# Patient Record
Sex: Male | Born: 1955 | Race: White | Hispanic: No | Marital: Married | Smoking: Former smoker
Health system: Southern US, Community
[De-identification: ages and names within clinical notes are randomized; demographics above are authoritative.]

## PROBLEM LIST (undated history)

## (undated) DIAGNOSIS — E785 Hyperlipidemia, unspecified: Secondary | ICD-10-CM

## (undated) DIAGNOSIS — I10 Essential (primary) hypertension: Secondary | ICD-10-CM

## (undated) DIAGNOSIS — I219 Acute myocardial infarction, unspecified: Secondary | ICD-10-CM

## (undated) HISTORY — PX: OTHER SURGICAL HISTORY: SHX169

## (undated) HISTORY — DX: Essential (primary) hypertension: I10

## (undated) HISTORY — DX: Hyperlipidemia, unspecified: E78.5

## (undated) HISTORY — DX: Acute myocardial infarction, unspecified: I21.9

---

## 2014-01-10 ENCOUNTER — Emergency Department (HOSPITAL_COMMUNITY): Payer: 59

## 2014-01-10 ENCOUNTER — Inpatient Hospital Stay (HOSPITAL_COMMUNITY)
Admission: EM | Admit: 2014-01-10 | Discharge: 2014-01-12 | DRG: 247 | Disposition: A | Discharge status: Home / Self Care | Payer: 59 | Attending: Interventional Cardiology | Admitting: Interventional Cardiology

## 2014-01-10 ENCOUNTER — Encounter (HOSPITAL_COMMUNITY): Payer: Self-pay | Admitting: Emergency Medicine

## 2014-01-10 ENCOUNTER — Encounter (HOSPITAL_COMMUNITY)
Admission: EM | Disposition: A | Discharge status: Home / Self Care | Payer: 59 | Source: Home / Self Care | Attending: Interventional Cardiology

## 2014-01-10 DIAGNOSIS — Z955 Presence of coronary angioplasty implant and graft: Secondary | ICD-10-CM

## 2014-01-10 DIAGNOSIS — R339 Retention of urine, unspecified: Secondary | ICD-10-CM | POA: Diagnosis not present

## 2014-01-10 DIAGNOSIS — I2582 Chronic total occlusion of coronary artery: Secondary | ICD-10-CM | POA: Diagnosis present

## 2014-01-10 DIAGNOSIS — Z7982 Long term (current) use of aspirin: Secondary | ICD-10-CM | POA: Diagnosis not present

## 2014-01-10 DIAGNOSIS — I2109 ST elevation (STEMI) myocardial infarction involving other coronary artery of anterior wall: Secondary | ICD-10-CM | POA: Diagnosis present

## 2014-01-10 DIAGNOSIS — E785 Hyperlipidemia, unspecified: Secondary | ICD-10-CM | POA: Diagnosis present

## 2014-01-10 DIAGNOSIS — I255 Ischemic cardiomyopathy: Secondary | ICD-10-CM

## 2014-01-10 DIAGNOSIS — Z7902 Long term (current) use of antithrombotics/antiplatelets: Secondary | ICD-10-CM | POA: Diagnosis not present

## 2014-01-10 DIAGNOSIS — I213 ST elevation (STEMI) myocardial infarction of unspecified site: Secondary | ICD-10-CM

## 2014-01-10 DIAGNOSIS — I10 Essential (primary) hypertension: Secondary | ICD-10-CM

## 2014-01-10 DIAGNOSIS — I2510 Atherosclerotic heart disease of native coronary artery without angina pectoris: Secondary | ICD-10-CM

## 2014-01-10 DIAGNOSIS — I2589 Other forms of chronic ischemic heart disease: Secondary | ICD-10-CM | POA: Diagnosis present

## 2014-01-10 DIAGNOSIS — Z79899 Other long term (current) drug therapy: Secondary | ICD-10-CM

## 2014-01-10 DIAGNOSIS — Z87891 Personal history of nicotine dependence: Secondary | ICD-10-CM | POA: Diagnosis not present

## 2014-01-10 DIAGNOSIS — R079 Chest pain, unspecified: Secondary | ICD-10-CM | POA: Diagnosis present

## 2014-01-10 HISTORY — PX: LEFT HEART CATHETERIZATION WITH CORONARY ANGIOGRAM: SHX5451

## 2014-01-10 LAB — COMPREHENSIVE METABOLIC PANEL
ALK PHOS: 69 U/L (ref 39–117)
ALT: 55 U/L — ABNORMAL HIGH (ref 0–53)
ANION GAP: 20 — AB (ref 5–15)
AST: 38 U/L — ABNORMAL HIGH (ref 0–37)
Albumin: 4 g/dL (ref 3.5–5.2)
BILIRUBIN TOTAL: 0.5 mg/dL (ref 0.3–1.2)
BUN: 15 mg/dL (ref 6–23)
CO2: 17 mEq/L — ABNORMAL LOW (ref 19–32)
Calcium: 9 mg/dL (ref 8.4–10.5)
Chloride: 98 mEq/L (ref 96–112)
Creatinine, Ser: 1.01 mg/dL (ref 0.50–1.35)
GFR calc Af Amer: >90 mL/min (ref >90)
GFR calc non Af Amer: 79 mL/min — ABNORMAL LOW (ref >90)
GLUCOSE: 169 mg/dL — AB (ref 70–99)
POTASSIUM: 4.2 meq/L (ref 3.7–5.3)
Sodium: 135 mEq/L — ABNORMAL LOW (ref 137–147)
TOTAL PROTEIN: 6.9 g/dL (ref 6.0–8.3)

## 2014-01-10 LAB — I-STAT TROPONIN, ED: Troponin i, poc: 0.01 ng/mL (ref 0.00–0.08)

## 2014-01-10 LAB — PLATELET COUNT: PLATELETS: 215 10*3/uL (ref 150–400)

## 2014-01-10 LAB — CBC
HEMATOCRIT: 47.8 % (ref 39.0–52.0)
Hemoglobin: 16.9 g/dL (ref 13.0–17.0)
MCH: 31 pg (ref 26.0–34.0)
MCHC: 35.4 g/dL (ref 30.0–36.0)
MCV: 87.7 fL (ref 78.0–100.0)
Platelets: 217 10*3/uL (ref 150–400)
RBC: 5.45 MIL/uL (ref 4.22–5.81)
RDW: 14 % (ref 11.5–15.5)
WBC: 6 10*3/uL (ref 4.0–10.5)

## 2014-01-10 LAB — TROPONIN I: Troponin I: <0.3 ng/mL (ref <0.30)

## 2014-01-10 LAB — MRSA PCR SCREENING: MRSA by PCR: NEGATIVE

## 2014-01-10 LAB — LIPASE, BLOOD: Lipase: 403 U/L — ABNORMAL HIGH (ref 11–59)

## 2014-01-10 SURGERY — LEFT HEART CATHETERIZATION WITH CORONARY ANGIOGRAM
Anesthesia: LOCAL

## 2014-01-10 MED ORDER — NITROGLYCERIN 1 MG/10 ML FOR IR/CATH LAB
INTRA_ARTERIAL | Status: AC
  Filled 2014-01-10: qty 10

## 2014-01-10 MED ORDER — HEPARIN SODIUM (PORCINE) 5000 UNIT/ML IJ SOLN
4000.0000 [IU] | INTRAMUSCULAR | Status: AC
  Administered 2014-01-10: 4000 [IU] via INTRAVENOUS
  Filled 2014-01-10: qty 1

## 2014-01-10 MED ORDER — MORPHINE SULFATE 4 MG/ML IJ SOLN
4.0000 mg | Freq: Once | INTRAMUSCULAR | Status: AC
  Administered 2014-01-10: 4 mg via INTRAVENOUS
  Filled 2014-01-10: qty 1

## 2014-01-10 MED ORDER — ONDANSETRON HCL 4 MG/2ML IJ SOLN
4.0000 mg | Freq: Once | INTRAMUSCULAR | Status: DC
  Filled 2014-01-10: qty 2

## 2014-01-10 MED ORDER — TICAGRELOR 90 MG PO TABS
90.0000 mg | ORAL_TABLET | Freq: Two times a day (BID) | ORAL | Status: DC
  Administered 2014-01-10 – 2014-01-12 (×4): 90 mg via ORAL
  Filled 2014-01-10 (×6): qty 1

## 2014-01-10 MED ORDER — BIVALIRUDIN 250 MG IV SOLR
INTRAVENOUS | Status: AC
  Filled 2014-01-10: qty 250

## 2014-01-10 MED ORDER — HEPARIN (PORCINE) IN NACL 2-0.9 UNIT/ML-% IJ SOLN
INTRAMUSCULAR | Status: AC
  Filled 2014-01-10: qty 500

## 2014-01-10 MED ORDER — ACETAMINOPHEN 325 MG PO TABS: 650.0000 mg | ORAL_TABLET | ORAL | Status: DC | PRN

## 2014-01-10 MED ORDER — ATORVASTATIN CALCIUM 80 MG PO TABS
80.0000 mg | ORAL_TABLET | Freq: Every day | ORAL | Status: DC
  Administered 2014-01-10 – 2014-01-11 (×2): 80 mg via ORAL
  Filled 2014-01-10 (×3): qty 1

## 2014-01-10 MED ORDER — TICAGRELOR 90 MG PO TABS
ORAL_TABLET | ORAL | Status: AC
  Filled 2014-01-10: qty 2

## 2014-01-10 MED ORDER — CETYLPYRIDINIUM CHLORIDE 0.05 % MT LIQD
7.0000 mL | Freq: Two times a day (BID) | OROMUCOSAL | Status: DC
  Administered 2014-01-10 – 2014-01-12 (×2): 7 mL via OROMUCOSAL

## 2014-01-10 MED ORDER — HEART ATTACK BOUNCING BOOK
Freq: Once | Status: AC
Start: 2014-01-11 — End: 2014-01-11
  Administered 2014-01-11: 06:00:00
  Filled 2014-01-10: qty 1

## 2014-01-10 MED ORDER — ONDANSETRON HCL 4 MG/2ML IJ SOLN
4.0000 mg | Freq: Once | INTRAMUSCULAR | Status: AC
  Administered 2014-01-10: 4 mg via INTRAVENOUS

## 2014-01-10 MED ORDER — TIROFIBAN HCL IV 5 MG/100ML
INTRAVENOUS | Status: AC
  Filled 2014-01-10: qty 100

## 2014-01-10 MED ORDER — ASPIRIN 81 MG PO CHEW
81.0000 mg | CHEWABLE_TABLET | Freq: Every day | ORAL | Status: DC
  Administered 2014-01-11 – 2014-01-12 (×2): 81 mg via ORAL
  Filled 2014-01-10 (×2): qty 1

## 2014-01-10 MED ORDER — NITROGLYCERIN 0.4 MG SL SUBL
0.4000 mg | SUBLINGUAL_TABLET | SUBLINGUAL | Status: DC | PRN
  Administered 2014-01-10 (×3): 0.4 mg via SUBLINGUAL
  Filled 2014-01-10 (×3): qty 1

## 2014-01-10 MED ORDER — MIDAZOLAM HCL 2 MG/2ML IJ SOLN
INTRAMUSCULAR | Status: AC
Start: 2014-01-10 — End: 2014-01-10
  Filled 2014-01-10: qty 2

## 2014-01-10 MED ORDER — OXYCODONE-ACETAMINOPHEN 5-325 MG PO TABS
1.0000 | ORAL_TABLET | ORAL | Status: DC | PRN
  Administered 2014-01-10 – 2014-01-11 (×3): 1 via ORAL
  Administered 2014-01-11: 2 via ORAL
  Filled 2014-01-10 (×2): qty 1
  Filled 2014-01-10: qty 2
  Filled 2014-01-10 (×2): qty 1

## 2014-01-10 MED ORDER — SODIUM CHLORIDE 0.9 % IV SOLN
1.0000 mL/kg/h | INTRAVENOUS | Status: AC
  Administered 2014-01-10: 1 mL/kg/h via INTRAVENOUS

## 2014-01-10 MED ORDER — METOPROLOL TARTRATE 12.5 MG HALF TABLET
12.5000 mg | ORAL_TABLET | Freq: Two times a day (BID) | ORAL | Status: DC
  Administered 2014-01-10 – 2014-01-11 (×3): 12.5 mg via ORAL
  Filled 2014-01-10 (×4): qty 1

## 2014-01-10 MED ORDER — LIDOCAINE HCL (PF) 1 % IJ SOLN
INTRAMUSCULAR | Status: AC
  Filled 2014-01-10: qty 30

## 2014-01-10 MED ORDER — NITROGLYCERIN IN D5W 200-5 MCG/ML-% IV SOLN
10.0000 ug/min | INTRAVENOUS | Status: AC
  Administered 2014-01-10: 10 ug/min via INTRAVENOUS

## 2014-01-10 MED ORDER — ASPIRIN 325 MG PO TABS
325.0000 mg | ORAL_TABLET | ORAL | Status: AC
  Administered 2014-01-10: 325 mg via ORAL
  Filled 2014-01-10: qty 1

## 2014-01-10 MED ORDER — VERAPAMIL HCL 2.5 MG/ML IV SOLN
INTRAVENOUS | Status: AC
  Filled 2014-01-10: qty 2

## 2014-01-10 MED ORDER — SODIUM CHLORIDE 0.9 % IV SOLN
INTRAVENOUS | Status: DC
  Administered 2014-01-10: 09:00:00 via INTRAVENOUS

## 2014-01-10 MED ORDER — FENTANYL CITRATE 0.05 MG/ML IJ SOLN
50.0000 ug | Freq: Once | INTRAMUSCULAR | Status: AC
  Administered 2014-01-10: 50 ug via INTRAVENOUS
  Filled 2014-01-10: qty 2

## 2014-01-10 MED ORDER — LISINOPRIL 2.5 MG PO TABS
2.5000 mg | ORAL_TABLET | Freq: Every day | ORAL | Status: DC
  Administered 2014-01-10 – 2014-01-11 (×2): 2.5 mg via ORAL
  Filled 2014-01-10 (×2): qty 1

## 2014-01-10 MED ORDER — FENTANYL CITRATE 0.05 MG/ML IJ SOLN
INTRAMUSCULAR | Status: AC
  Filled 2014-01-10: qty 2

## 2014-01-10 MED ORDER — TIROFIBAN (AGGRASTAT) BOLUS VIA INFUSION
25.0000 ug/kg | Freq: Once | INTRAVENOUS | Status: AC
  Administered 2014-01-10: 1927.5 ug via INTRAVENOUS
  Filled 2014-01-10: qty 39

## 2014-01-10 MED ORDER — ONDANSETRON HCL 4 MG/2ML IJ SOLN
4.0000 mg | Freq: Four times a day (QID) | INTRAMUSCULAR | Status: DC | PRN
  Administered 2014-01-10: 4 mg via INTRAVENOUS
  Filled 2014-01-10: qty 2

## 2014-01-10 NOTE — Care Management Note (Signed)
    Page 1 of 1   01/10/2014     12:00:08 PM CARE MANAGEMENT NOTE 01/10/2014  Patient:  Brian Dickerson,Brian Dickerson   Account Number:  [id number]  Date Initiated:  01/10/2014  Documentation initiated by:  Elissa Hefty  Subjective/Objective Assessment:   adm w mi     Action/Plan:   lives w wife, pcp dr Aura Dials   Anticipated DC Date:     Anticipated DC Plan:        [place]  CM consult  Medication Assistance      Choice offered to / List presented to:             Status of service:   Medicare Important Message given?   (If response is "NO", the following Medicare IM given date fields will be blank) Date Medicare IM given:   Medicare IM given by:   Date Additional Medicare IM given:   Additional Medicare IM given by:    Discharge Disposition:    Per UR Regulation:  Reviewed for med. necessity/level of care/duration of stay  If discussed at [place] of Stay Meetings, dates discussed:    Comments:  9/4 1159 debbie Bryne Lindon rn,bsn left pt 30day free brilinta card and copay assist card.

## 2014-01-10 NOTE — CV Procedure (Addendum)
Left Heart Catheterization with Coronary Angiography and PCI Report  Brian Dickerson  58 y.o.  male 12-29-1955  Procedure Date: 01/10/2014 Referring Physician: None Primary Cardiologist: [facility] Blenda Bridegroom, M.D.  INDICATIONS: Acute coronary syndrome and possible ST elevation MI (EKG is ambiguous)  PROCEDURE: 1. Left heart catheterization; 2. Coronary angiography; 3. Left ventriculography; 4. Drug-eluting stent mid LAD  CONSENT:  The risks, benefits, and details of the procedure were explained in detail to the patient. Risks including death, stroke, heart attack, kidney injury, allergy, limb ischemia, bleeding and radiation injury were discussed.  The patient verbalized understanding and wanted to proceed.  Informed written consent was obtained.  PROCEDURE TECHNIQUE:  After Xylocaine anesthesia a 5 French Slender sheath was placed in the right radial artery with an angiocath and the modified Seldinger technique.  Coronary angiography was done using a 5 F JR 4 and XB LAD 3.5 cm guide catheter.  Left ventriculography was done using the JR 4 catheter and hand injection.   The digital images were reviewed and demonstrated that the mid LAD beyond a large second diagonal was totally occluded. The LV gram also demonstrated distal anterior wall and inferoapical severe hypokinesis.  We gave intravenous bivalirudin bolus and an infusion to achieve an ACT of 300. The patient was loaded with Brilinta, 180 mg orally . We then perform PCI on the LAD over a 0.014 Pro-water angioplasty wire. This caused partial reperfusion and significant thrombus burden was noted beyond the large diagonal. We initially used a 0.014 BMW wire to protect the large diagonal branch during the initial inflation. The diagonal branch remained patent after predilating the total occlusion and segmental stenosis with a 3.0 x 20 mm balloon. We then positioned and deployed a 3.0 x 38 mm long Promus Premier drug-eluting stent deployed at  12 atmospheres. Postdilatation was then performed using a 3.5 x 20 mm long Barling Euphora to 13 atmospheres throughout the stented segment. Because of persistent impingement on the stent in the mid vessel a 3.75 x 12 mm U4 balloon was positioned and inflated to 13 atmospheres. This balloon was then used in an overlapping fashion extending back to the proximal margin. Multiple doses of intracoronary nitroglycerin were administered. Post procedure TIMI grade 3 flow was noted. The second and third diagonal branches remained patent with TIMI grade 3 flow .  There were no complications. Hemostasis was achieved with a wrist band in the right radial region.   CONTRAST:  Total of 250 cc.  COMPLICATIONS:  Urinary retention   HEMODYNAMICS:  Aortic pressure 132/65 mmHg; LV pressure 132/5 mmHg; LVEDP 13 mm mercury  ANGIOGRAPHIC DATA:   The left main coronary artery is widely patent.  The left anterior descending artery is severely diseased with a segmental 90% stenosis starting after the first septal perforator becoming a total occlusion after the large second diagonal branch. The diagonals are widely patent despite high-grade disease in the mid LAD.  The left circumflex artery is widely patent and gives origin to 2 obtuse marginal branches which contain no significant obstruction..  The right coronary artery is dominant. Mid vessel 30% narrowing is noted. A large PDA and 2 left ventricular branch is also widely patent.  PCI RESULTS: The totally occluded segmental stenosis in the mid LAD was reduced to 0% with TIMI grade 3 flow after positioning and deploying a 3.0 x 38 mm long drug-eluting stent (Promus Premier) post dilated to 3.75 mm in diameter.  LEFT VENTRICULOGRAM:  Left ventricular angiogram was  done in the 30 RAO projection and revealed apical and inferoapical severe hypokinesis. Ejection fraction 40%.   IMPRESSIONS:  1. Acute coronary syndrome/ST elevation anterior myocardial infarction due to  thrombotic occlusion of the mid LAD treated with drug-eluting stent with resultant 0% stenosis and TIMI grade 3 flow as outlined above. 2. Widely patent circumflex and right coronary 3. Left ventricular dysfunction with apical and inferoapical severe hypokinesis. LVEF 40% 4. Time to reperfusion was increased due to ambiguous EKG, that in retrospect was highly suggestive of ST elevation MI given the patient's complaints.   RECOMMENDATION:  Aspirin and Brilinta Beta blocker therapy ACE inhibitor therapy Statin therapy Potential candidate for early discharge on Sunday morning assuming no arrhythmias, heart failure, recurrent ischemia, or mechanical complications. Aggrastat bolus given at completion of case the thrombus burden.

## 2014-01-10 NOTE — Progress Notes (Signed)
Right radial TR band removed; site level 0.  Radial pulses equal/+3.  Will continue to monitor. [place], Ardeth Sportsman

## 2014-01-10 NOTE — ED Notes (Addendum)
First EKG unclear, repeat at 7:15 Delay due to hair on the PT's chest.

## 2014-01-10 NOTE — ED Provider Notes (Signed)
CSN: [id number]     Arrival date & time 01/10/14  0703 History   First MD Initiated Contact with Patient 01/10/14 [phone]     Chief Complaint  Patient presents with  . Chest Pain     (Consider location/radiation/quality/duration/timing/severity/associated sxs/prior Treatment) Patient is a 58 y.o. male presenting with chest pain. The history is provided by the patient.  Chest Pain Pain location:  Substernal area Pain quality: tightness   Radiates to: initially upper back, buttently no radiation. Pain severity:  Severe Duration:  2 hours Timing:  Constant Progression:  Waxing and waning Chronicity:  New Context: not breathing, no drug use, not lifting, no movement, no stress and no trauma   Associated symptoms: diaphoresis, dizziness, nausea and near-syncope   Associated symptoms: no shortness of breath and no syncope   Risk factors: male sex and smoking   Risk factors: no aortic disease, no coronary artery disease, no diabetes mellitus, no Ehlers-Danlos syndrome, no high cholesterol, no hypertension, no immobilization, no Marfan's syndrome and no prior DVT/PE     History reviewed. No pertinent past medical history. History reviewed. No pertinent past surgical history. History reviewed. No pertinent family history. History  Substance Use Topics  . Smoking status: Former Smoker    Types: Cigarettes  . Smokeless tobacco: Never Used  . Alcohol Use: 2.4 oz/week    4 Cans of beer per week    Review of Systems  Constitutional: Positive for chills and diaphoresis.  Eyes: Negative for visual disturbance.  Respiratory: Negative for shortness of breath.   Cardiovascular: Positive for chest pain and near-syncope. Negative for syncope.  Gastrointestinal: Positive for nausea.  Genitourinary: Negative for dysuria.  Skin: Negative for rash.  Neurological: Positive for dizziness and light-headedness. Negative for syncope.  Hematological: Does not bruise/bleed easily.   Psychiatric/Behavioral: Negative for confusion.  All other systems reviewed and are negative.     Allergies  Review of patient's allergies indicates no known allergies.  Home Medications   Prior to Admission medications   Not on File   BP 124/75  Pulse 53  Temp(Src) 97.9 F (36.6 C) (Oral)  Resp 14  Ht 5\' 5"  (1.651 m)  Wt 170 lb (77.111 kg)  BMI 28.29 kg/m2  SpO2 97% Physical Exam  Nursing note and vitals reviewed. Constitutional: He is oriented to person, place, and time. He appears well-developed.  HENT:  Head: Normocephalic and atraumatic.  Eyes: Conjunctivae and EOM are normal. Pupils are equal, round, and reactive to light.  Neck: Normal range of motion. Neck supple. No JVD present.  Cardiovascular: Normal rate, regular rhythm and intact distal pulses.   No murmur heard. Pulmonary/Chest: Effort normal and breath sounds normal.  Abdominal: Soft. Bowel sounds are normal. He exhibits no distension. There is no tenderness. There is no rebound and no guarding.  Musculoskeletal: Normal range of motion. He exhibits no edema and no tenderness.  Neurological: He is alert and oriented to person, place, and time. No cranial nerve deficit.  Skin: Skin is warm. He is not diaphoretic.    ED Course  Procedures (including critical care time) Labs Review Labs Reviewed  LIPASE, BLOOD - Abnormal; Notable for the following:    Lipase 403 (*)    All other components within normal limits  COMPREHENSIVE METABOLIC PANEL - Abnormal; Notable for the following:    CO2 17 (*)    Glucose, Bld 169 (*)    AST 38 (*)    ALT 55 (*)    GFR calc non  Af Amer 79 (*)    All other components within normal limits  CBC  TROPONIN I  I-STAT TROPOININ, ED    Imaging Review Dg Chest 2 View  01/10/2014   CLINICAL DATA:  Chest pain  EXAM: CHEST  2 VIEW  COMPARISON:  None.  FINDINGS: The heart size and mediastinal contours are within normal limits. Both lungs are clear. The visualized skeletal  structures are unremarkable.  IMPRESSION: No acute cardiopulmonary process.   Electronically Signed   By: Lovey Newcomer M.D.   On: 01/10/2014 08:33     EKG Interpretation   Date/Time:  Friday January 10 2014 07:51:27 EDT Ventricular Rate:  54 PR Interval:  118 QRS Duration: 91 QT Interval:  433 QTC Calculation: 410 R Axis:   22 Text Interpretation:  Sinus rhythm Borderline short PR interval ST  elevation, consider anterior injury, anterior leads, more pronounced New  changes appreciated Confirmed by Kathrynn Humble, MD, Skilar Marcou [phone]) on 01/10/2014  8:58:30 AM         EKG Interpretation  Date/Time:  Friday January 10 2014 07:51:27 EDT Ventricular Rate:  54 PR Interval:  118 QRS Duration: 91 QT Interval:  433 QTC Calculation: 410 R Axis:   22 Text Interpretation:  Sinus rhythm Borderline short PR interval ST elevation, consider anterior injury, anterior leads, more pronounced New changes appreciated Confirmed by Kathrynn Humble, MD, Thelma Comp [phone]) on 01/10/2014 8:58:30 AM        Date: 01/10/2014  Rate: 53  Rhythm: sinus bradycardia  QRS Axis: normal  Intervals: normal  ST/T Wave abnormalities: ST elevations inferiorly and ST elevations anteriorly  Conduction Disutrbances:none  Narrative Interpretation:   Old EKG Reviewed: changes noted  Inf STE more pronounced, still < 28mm, V2-v4 unchanged from the previous.     MDM   Final diagnoses:  ST elevation myocardial infarction (STEMI), unspecified artery    Pt comes in with cc of chest pain. Atypical. Misternal mostly, and at one point radiating to the back. No risk factors for CAD, besides former smoker and age. Pt reportedly clammy at some point. Currently, not in distress, but appears uncomfortable. Neurovascular exam is normal. No risk factors for dissection.  Initial EKG - STEMI not called. Some inconsistent changes noted in v2, v3.  I ordered a repeat EKG after my evaluation, and the repeat shows a bit more concerning ST  changes in v2, v3. Given i was taking care of patient with respiratory distress on bipap in resuscitation room, the 2nd ekg was interpreted a little later.  Pt was immediately reassessed, and his pain was still the same, not responsive to nitro. 3rd EKG was ordered, and Cath team called. Spoke with Dr. Martinique at 8:50, and the 3rd ekg had printed by that time, and after discussing the case with Dr. Martinique, [place] [facility] activated.  Pt hemodynamically stable. Made aware of the plan to go to the cath lab.  CRITICAL CARE Performed by: Varney Biles   Total critical care time: 45 minutes  Critical care time was exclusive of separately billable procedures and treating other patients.  Critical care was necessary to treat or prevent imminent or life-threatening deterioration.  Critical care was time spent personally by me on the following activities: development of treatment plan with patient and/or surrogate as well as nursing, discussions with consultants, evaluation of patient's response to treatment, examination of patient, obtaining history from patient or surrogate, ordering and performing treatments and interventions, ordering and review of laboratory studies, ordering and review of radiographic studies,  pulse oximetry and re-evaluation of patient's condition.     Varney Biles, MD 01/10/14 [phone]

## 2014-01-10 NOTE — H&P (Addendum)
Brian Dickerson is a 58 y.o. male  Admit Date: 01/10/2014 Referring Physician: [facility] Primary Cardiologist:: None Chief complaint / reason for admission: Prolonged chest pain with ambiguous EKG abnormalities anteriorly  HPI: A 58 year old gentleman who presented to the emergency room with substernal discomfort. Initial EKG demonstrated prominent precordial T waves with minimal ST elevation. Chest discomfort as being continuous and ongoing since presentation at 7 AM. The discomfort started spontaneously. Is no radiation or other associated symptoms such as nausea. Has no history of heart disease, diabetes, smoking, or other risk factors. Because of ongoing chest discomfort and ambiguous ECG findings on the anterior leads, I have counseled the patient to undergo coronary angiography and possible PCI if an acute LAD lesion is demonstrated.    PMH:   History reviewed. No pertinent past medical history.  PSH:    Past Surgical History  Procedure Laterality Date  . None     ALLERGIES:   Review of patient's allergies indicates no known allergies. Prior to Admit Meds:   Prescriptions prior to admission  Medication Sig Dispense Refill  . Cholecalciferol (VITAMIN D) 2000 UNITS tablet Take 2,000 Units by mouth daily.       Family HX:   History reviewed. No pertinent family history. Social HX:    History   Social History  . Marital Status: Married    Spouse Name: N/A    Number of Children: N/A  . Years of Education: N/A   Occupational History  . Not on file.   Social History Main Topics  . Smoking status: Former Smoker    Types: Cigarettes  . Smokeless tobacco: Never Used  . Alcohol Use: 2.4 oz/week    4 Cans of beer per week  . Drug Use: No  . Sexual Activity: Not on file   Other Topics Concern  . Not on file   Social History Narrative  . No narrative on file     ROS in good health until chest discomfort started this a.m. No history of stroke,  claudication, myocardial infarction, hypertension, diabetes, hyperlipidemia,  Physical Exam: Blood pressure 125/74, pulse 70, temperature 97.9 F (36.6 C), temperature source Oral, resp. rate 14, height 5\' 5"  (1.651 m), weight 164 lb 3.9 oz (74.5 kg), SpO2 100.00%.   Lying in the emergency room bed expressionless and unable to make eye contact. Eyes are closed. Skin is warm and dry. HEENT exam reveals no jaundice or Neck exam reveals no JVD or carotid bruits Cardiac exam reveals an S4 gallop but no murmur or rub Chest is clear to auscultation and percussion The abdomen is soft without tenderness or pulsatile masses Extremities reveal no edema. Radial pulses and femoral pulses are 2+ and symmetric is no peripheral edema Neurological exam reveals a depressed affect but no focal deficits Labs: Lab Results  Component Value Date   WBC 6.0 01/10/2014   HGB 16.9 01/10/2014   HCT 47.8 01/10/2014   MCV 87.7 01/10/2014   PLT 217 01/10/2014     Recent Labs Lab 01/10/14 0735  NA 135*  K 4.2  CL 98  CO2 17*  BUN 15  CREATININE 1.01  CALCIUM 9.0  PROT 6.9  BILITOT 0.5  ALKPHOS 69  ALT 55*  AST 38*  GLUCOSE 169*   Lab Results  Component Value Date   TROPONINI <0.30 01/10/2014     Radiology:  Chest x-ray is unremarkable  EKG:  ECG reveals prominent T waves with mild J-point elevation no reciprocal  ST segment changes. There is mild J-point elevation in V3 through V6 as well as 23 and aVF. The EKG is suspicious for STEMI is somewhat ambiguous.  ASSESSMENT: 1. Acute coronary syndrome and possibly ST elevation MI although the EKG is not currently diagnostic. The presentation however is impressive and with ongoing chest pain we should treat for STEMI. 2. The attached and depressed affect  Plan:  Emergency coronary angiography is recommended to define anatomy and treat underlying disease if obstruction is found. He could conceivably have a total occlusion with collaterals which are preventing  the EKG from being diagnostic.  The procedure including risks of stroke, death, myocardial infarction, allergy, kidney injury, limb ischemia, among others were discussed in detail and except above the patient.  We were continuously with the patient from first encounter in the emergency room and help to transport him to the cardiac catheterization laboratory on the emergency circumstances. Critical care services were provided a patient who is acutely ill with acute coronary syndrome.  Sinclair Grooms 01/10/2014 11:35 AM

## 2014-01-10 NOTE — Progress Notes (Addendum)
CRITICAL VALUE ALERT  Critical value received: troponin >20 Date of notification:  01/10/2013  Time of notification:  9381  Critical value read back:Yes.    Nurse who received alert:  Deberah Castle   MD notified (1st page):   Time of first page:    MD notified (2nd page):  Time of second page:  Responding MD:    Time MD responded:  Patient was a STEMI; Post PCI

## 2014-01-10 NOTE — ED Notes (Addendum)
Pt from home with c/o sudden onset 9/10 chest pain described as a constant, deep pressure this am.  Pt reports feeling upper back pain, lightheadedness and some nausea.  Pt is cool, pale, and clammy to touch.  Pt in NAD, A&O.

## 2014-01-10 NOTE — ED Notes (Signed)
Placed pt on 2L O2 nasal cannula

## 2014-01-10 NOTE — ED Notes (Signed)
Patient transported to X-ray 

## 2014-01-11 DIAGNOSIS — I219 Acute myocardial infarction, unspecified: Secondary | ICD-10-CM

## 2014-01-11 DIAGNOSIS — I2589 Other forms of chronic ischemic heart disease: Secondary | ICD-10-CM

## 2014-01-11 DIAGNOSIS — I2109 ST elevation (STEMI) myocardial infarction involving other coronary artery of anterior wall: Secondary | ICD-10-CM

## 2014-01-11 DIAGNOSIS — I10 Essential (primary) hypertension: Secondary | ICD-10-CM

## 2014-01-11 LAB — CBC
HCT: 48.1 % (ref 39.0–52.0)
Hemoglobin: 16.3 g/dL (ref 13.0–17.0)
MCH: 30.8 pg (ref 26.0–34.0)
MCHC: 33.9 g/dL (ref 30.0–36.0)
MCV: 90.8 fL (ref 78.0–100.0)
PLATELETS: 221 10*3/uL (ref 150–400)
RBC: 5.3 MIL/uL (ref 4.22–5.81)
RDW: 14.5 % (ref 11.5–15.5)
WBC: 10.2 10*3/uL (ref 4.0–10.5)

## 2014-01-11 LAB — BASIC METABOLIC PANEL
Anion gap: 11 (ref 5–15)
BUN: 10 mg/dL (ref 6–23)
CALCIUM: 9.3 mg/dL (ref 8.4–10.5)
CO2: 27 mEq/L (ref 19–32)
Chloride: 100 mEq/L (ref 96–112)
Creatinine, Ser: 1.06 mg/dL (ref 0.50–1.35)
GFR calc Af Amer: 87 mL/min — ABNORMAL LOW (ref >90)
GFR, EST NON AFRICAN AMERICAN: 75 mL/min — AB (ref >90)
Glucose, Bld: 110 mg/dL — ABNORMAL HIGH (ref 70–99)
POTASSIUM: 4.9 meq/L (ref 3.7–5.3)
SODIUM: 138 meq/L (ref 137–147)

## 2014-01-11 LAB — TROPONIN I

## 2014-01-11 MED ORDER — METOPROLOL TARTRATE 25 MG PO TABS
25.0000 mg | ORAL_TABLET | Freq: Two times a day (BID) | ORAL | Status: DC
  Administered 2014-01-11: 25 mg via ORAL
  Filled 2014-01-11 (×3): qty 1

## 2014-01-11 MED ORDER — LISINOPRIL 5 MG PO TABS
5.0000 mg | ORAL_TABLET | Freq: Every day | ORAL | Status: DC
  Administered 2014-01-12: 5 mg via ORAL
  Filled 2014-01-11: qty 1

## 2014-01-11 NOTE — Progress Notes (Signed)
CARDIAC REHAB PHASE I   PRE:  Rate/Rhythm: Sinus rhythm T wave inversion 65  BP:    Sitting: 111/72    SaO2: 99% Room Air  MODE:  Ambulation: 550 ft   POST:  Rate/Rhythem: 67  BP:    Sitting: 123/71     SaO2: 100% Room Air  Patient ambulated in the hallway without complaints or chest pain independently. Patient given MI booklet, stent card and Brillinta brochure. Reviewed exercise instructions end points of exercise when to call 911 and sublingual nitroglycerin. Brian Dickerson is interested in phase 2 cardiac rehab. Permission granted to contact the patient at home.  [name], [name] RN BSN

## 2014-01-11 NOTE — Progress Notes (Signed)
SUBJECTIVE: The patient is doing well today.  At this time, he denies chest pain, shortness of breath, or any new concerns.  Marland Kitchen antiseptic oral rinse  7 mL Mouth Rinse BID  . aspirin  81 mg Oral Daily  . atorvastatin  80 mg Oral q1800  . lisinopril  2.5 mg Oral Daily  . metoprolol tartrate  12.5 mg Oral BID  . ticagrelor  90 mg Oral BID      OBJECTIVE: Physical Exam: Filed Vitals:   01/11/14 0600 01/11/14 0700 01/11/14 0730 01/11/14 0800  BP: 117/79 130/77 130/77 118/71  Pulse: 54 57  65  Temp:   98.7 F (37.1 C)   TempSrc:   Oral   Resp: 14 15 27 18   Height:      Weight:      SpO2: 97% 96% 95% 95%    Intake/Output Summary (Last 24 hours) at 01/11/14 0946 Last data filed at 01/11/14 0800  Gross per 24 hour  Intake 1198.95 ml  Output   1400 ml  Net -201.05 ml    Telemetry reveals sinus rhythm, reduced ventricular ectopy  GEN- The patient is well appearing, alert and oriented x 3 today.   Head- normocephalic, atraumatic Eyes-  Sclera clear, conjunctiva pink Ears- hearing intact Oropharynx- clear Neck- supple,  Lungs- Clear to ausculation bilaterally, normal work of breathing Heart- Regular rate and rhythm, no murmurs, rubs or gallops, PMI not laterally displaced GI- soft, NT, ND, + BS Extremities- no clubbing, cyanosis, or edema, R wrist access site looks good Skin- no rash or lesion Psych- euthymic mood, full affect Neuro- strength and sensation are intact  LABS: Basic Metabolic Panel:  Recent Labs  01/10/14 0735 01/11/14 0243  NA 135* 138  K 4.2 4.9  CL 98 100  CO2 17* 27  GLUCOSE 169* 110*  BUN 15 10  CREATININE 1.01 1.06  CALCIUM 9.0 9.3   Liver Function Tests:  Recent Labs  01/10/14 0735  AST 38*  ALT 55*  ALKPHOS 69  BILITOT 0.5  PROT 6.9  ALBUMIN 4.0    Recent Labs  01/10/14 0735  LIPASE 403*   CBC:  Recent Labs  01/10/14 0735 01/10/14 1840 01/11/14 0243  WBC 6.0  --  10.2  HGB 16.9  --  16.3  HCT 47.8  --  48.1    MCV 87.7  --  90.8  PLT 217 215 221   Cardiac Enzymes:  Recent Labs  01/10/14 1235 01/10/14 1630 01/10/14 2350  TROPONINI >20.00* >20.00* >20.00*   RADIOLOGY: Dg Chest 2 View  01/10/2014   CLINICAL DATA:  Chest pain  EXAM: CHEST  2 VIEW  COMPARISON:  None.  FINDINGS: The heart size and mediastinal contours are within normal limits. Both lungs are clear. The visualized skeletal structures are unremarkable.  IMPRESSION: No acute cardiopulmonary process.   Electronically Signed   By: Lovey Newcomer M.D.   On: 01/10/2014 08:33    ASSESSMENT AND PLAN:  Principal Problem:   Acute MI anterior wall first episode care Active Problems:   Acute anterior wall MI  1. Acute coronary syndrome/ST elevation anterior myocardial infarction due to thrombotic occlusion of the mid LAD treated with drug-eluting stent with resultant 0% stenosis and TIMI grade 3 flow as outlined above.  Doing well this am Continue to optimize medical therapy Will increase metoprolol to 25mg  BID today Increase lisinopril to 5mg  daily Transfer to telemetry  2. Ischemic CM Continue to optimize medical therapy Will need outpatient echo  to follow  3. HTN Stable No change required today  4. HL Stable No change required today  Per Dr Tamala Julian, would anticipate discharge on Sunday if stable    Thompson Grayer, MD 01/11/2014 9:46 AM

## 2014-01-12 MED ORDER — LISINOPRIL 5 MG PO TABS: 5.0000 mg | ORAL_TABLET | Freq: Every day | ORAL | Status: DC

## 2014-01-12 MED ORDER — ATORVASTATIN CALCIUM 80 MG PO TABS: 80.0000 mg | ORAL_TABLET | Freq: Every day | ORAL | Status: DC

## 2014-01-12 MED ORDER — TICAGRELOR 90 MG PO TABS: 90.0000 mg | ORAL_TABLET | Freq: Two times a day (BID) | ORAL | Status: DC

## 2014-01-12 MED ORDER — ASPIRIN 81 MG PO CHEW: 81.0000 mg | CHEWABLE_TABLET | Freq: Every day | ORAL | Status: AC

## 2014-01-12 MED ORDER — NITROGLYCERIN 0.4 MG SL SUBL: 0.4000 mg | SUBLINGUAL_TABLET | SUBLINGUAL | Status: DC | PRN

## 2014-01-12 MED ORDER — METOPROLOL SUCCINATE ER 50 MG PO TB24: 50.0000 mg | ORAL_TABLET | Freq: Every day | ORAL | Status: DC

## 2014-01-12 MED ORDER — METOPROLOL SUCCINATE ER 50 MG PO TB24
50.0000 mg | ORAL_TABLET | Freq: Every day | ORAL | Status: DC
  Administered 2014-01-12: 50 mg via ORAL
  Filled 2014-01-12 (×2): qty 1

## 2014-01-12 NOTE — Discharge Summary (Signed)
Physician Discharge Summary     Cardiologist:  Tamala Julian Patient ID: Brian Dickerson MRN: [id number] DOB/AGE: [birth year] 58 y.o.  Admit date: 01/10/2014 Discharge date: 01/12/2014  Admission Diagnoses:    Acute MI anterior wall first episode care  Discharge Diagnoses:  Principal Problem:   Acute MI anterior wall first episode care Active Problems:   Acute anterior wall MI   HTN   HLD   Iscm  Discharged Condition:  Stable  Hospital Course:   A 58 year old gentleman who presented to the emergency room with substernal discomfort. Initial EKG demonstrated prominent precordial T waves with minimal ST elevation. Chest discomfort as being continuous and ongoing since presentation at 7 AM. The discomfort started spontaneously. Is no radiation or other associated symptoms such as nausea. Has no history of heart disease, diabetes, smoking, or other risk factors.   Because of ongoing chest discomfort and ambiguous ECG findings on the anterior leads, he was counseled to undergo coronary angiography and possible PCI if an acute LAD lesion is demonstrated.  The patient was taken for emergent left heart cath which revealed thrombotic occlusion of the mid LAD.  This was treated with drug-eluting stent with resultant 0% stenosis and TIMI grade 3 flow.  Widely patent circumflex and right coronary.  Left ventricular dysfunction with apical and inferoapical severe hypokinesis. LVEF 40%.  He was started on ASA, Brililnta, lisinopril, toprol statin.  The patient ambulated 581ft with Cardiac rehab and now complaints of CP.  The patient was seen by Dr. Stanford Breed who felt he was stable for DC home.    Consults:  Cardiac rehab  Significant Diagnostic Studies:    Left Heart Catheterization with Coronary Angiography and PCI Report  Brian Dickerson  58 y.o.  male  03-13-1956  Procedure Date: 01/10/2014  Referring Physician: None  Primary Cardiologist: HWB Blenda Bridegroom, M.D.  INDICATIONS: Acute coronary syndrome  and possible ST elevation MI (EKG is ambiguous)  PROCEDURE: 1. Left heart catheterization; 2. Coronary angiography; 3. Left ventriculography; 4. Drug-eluting stent mid LAD  CONSENT:  The risks, benefits, and details of the procedure were explained in detail to the patient. Risks including death, stroke, heart attack, kidney injury, allergy, limb ischemia, bleeding and radiation injury were discussed. The patient verbalized understanding and wanted to proceed. Informed written consent was obtained.  PROCEDURE TECHNIQUE: After Xylocaine anesthesia a 5 French Slender sheath was placed in the right radial artery with an angiocath and the modified Seldinger technique. Coronary angiography was done using a 5 F JR 4 and XB LAD 3.5 cm guide catheter. Left ventriculography was done using the JR 4 catheter and hand injection.  The digital images were reviewed and demonstrated that the mid LAD beyond a large second diagonal was totally occluded. The LV gram also demonstrated distal anterior wall and inferoapical severe hypokinesis.  We gave intravenous bivalirudin bolus and an infusion to achieve an ACT of 300. The patient was loaded with Brilinta, 180 mg orally . We then perform PCI on the LAD over a 0.014 Pro-water angioplasty wire. This caused partial reperfusion and significant thrombus burden was noted beyond the large diagonal. We initially used a 0.014 BMW wire to protect the large diagonal branch during the initial inflation. The diagonal branch remained patent after predilating the total occlusion and segmental stenosis with a 3.0 x 20 mm balloon. We then positioned and deployed a 3.0 x 38 mm long Promus Premier drug-eluting stent deployed at 12 atmospheres. Postdilatation was then performed using a 3.5 x 20 mm long  [place] Euphora to 13 atmospheres throughout the stented segment. Because of persistent impingement on the stent in the mid vessel a 3.75 x 12 mm U4 balloon was positioned and inflated to 13 atmospheres.  This balloon was then used in an overlapping fashion extending back to the proximal margin. Multiple doses of intracoronary nitroglycerin were administered. Post procedure TIMI grade 3 flow was noted. The second and third diagonal branches remained patent with TIMI grade 3 flow .  There were no complications. Hemostasis was achieved with a wrist band in the right radial region.  CONTRAST: Total of 250 cc.  COMPLICATIONS: Urinary retention  HEMODYNAMICS: Aortic pressure 132/65 mmHg; LV pressure 132/5 mmHg; LVEDP 13 mm mercury  ANGIOGRAPHIC DATA: The left main coronary artery is widely patent.  The left anterior descending artery is severely diseased with a segmental 90% stenosis starting after the first septal perforator becoming a total occlusion after the large second diagonal branch. The diagonals are widely patent despite high-grade disease in the mid LAD.  The left circumflex artery is widely patent and gives origin to 2 obtuse marginal branches which contain no significant obstruction..  The right coronary artery is dominant. Mid vessel 30% narrowing is noted. A large PDA and 2 left ventricular branch is also widely patent.  PCI RESULTS: The totally occluded segmental stenosis in the mid LAD was reduced to 0% with TIMI grade 3 flow after positioning and deploying a 3.0 x 38 mm long drug-eluting stent (Promus Premier) post dilated to 3.75 mm in diameter.  LEFT VENTRICULOGRAM: Left ventricular angiogram was done in the 30 RAO projection and revealed apical and inferoapical severe hypokinesis. Ejection fraction 40%.  IMPRESSIONS: 1. Acute coronary syndrome/ST elevation anterior myocardial infarction due to thrombotic occlusion of the mid LAD treated with drug-eluting stent with resultant 0% stenosis and TIMI grade 3 flow as outlined above.  2. Widely patent circumflex and right coronary  3. Left ventricular dysfunction with apical and inferoapical severe hypokinesis. LVEF 40%  4. Time to reperfusion  was increased due to ambiguous EKG, that in retrospect was highly suggestive of ST elevation MI given the patient's complaints.  RECOMMENDATION: Aspirin and Brilinta  Beta blocker therapy  ACE inhibitor therapy  Statin therapy  Potential candidate for early discharge on Sunday morning assuming no arrhythmias, heart failure, recurrent ischemia, or mechanical complications.  Aggrastat bolus given at completion of case the thrombus burden.  CHEST 2 VIEW  COMPARISON: None.  FINDINGS:  The heart size and mediastinal contours are within normal limits.  Both lungs are clear. The visualized skeletal structures are  unremarkable.  IMPRESSION:  No acute cardiopulmonary process.   Treatments: See bove  Discharge Exam: Blood pressure 129/70, pulse 63, temperature 99.3 F (37.4 C), temperature source Oral, resp. rate 18, height 5\' 5"  (1.651 m), weight 164 lb 3.9 oz (74.5 kg), SpO2 97.00%.   Disposition: Final discharge disposition not confirmed      Discharge Instructions   Amb Referral to Cardiac Rehabilitation    Complete by:  As directed      Diet - low sodium heart healthy    Complete by:  As directed      Discharge instructions    Complete by:  As directed   Weight yourself daily in the morning.  If you gain 3 pounds in 24 hours, or 5 pounds in a week, call our office for [phone].     Increase activity slowly    Complete by:  As directed  Medication List         aspirin 81 MG chewable tablet  Chew 1 tablet (81 mg total) by mouth daily.     atorvastatin 80 MG tablet  Commonly known as:  LIPITOR  Take 1 tablet (80 mg total) by mouth daily at 6 PM.     lisinopril 5 MG tablet  Commonly known as:  PRINIVIL,ZESTRIL  Take 1 tablet (5 mg total) by mouth daily.     metoprolol succinate 50 MG 24 hr tablet  Commonly known as:  TOPROL-XL  Take 1 tablet (50 mg total) by mouth daily. Take with or immediately following a meal.     nitroGLYCERIN 0.4 MG SL  tablet  Commonly known as:  NITROSTAT  Place 1 tablet (0.4 mg total) under the tongue every 5 (five) minutes as needed for chest pain.     ticagrelor 90 MG Tabs tablet  Commonly known as:  BRILINTA  Take 1 tablet (90 mg total) by mouth 2 (two) times daily.     Vitamin D 2000 UNITS tablet  Take 2,000 Units by mouth daily.       Follow-up Information   Follow up with Sinclair Grooms, MD. (The office will call you with the appt date and time. )    Specialty:  Cardiology   Contact information:   [address]  [place] [address] [phone]      Greater than 30 minutes was spent completing the patient's discharge.    SignedTarri Fuller, [place] 01/12/2014, 10:30 AM

## 2014-01-12 NOTE — Discharge Summary (Signed)
See progress notes Brian Crenshaw  

## 2014-01-12 NOTE — Progress Notes (Signed)
   SUBJECTIVE: Brief dyspnea this AM that he attributes to anxiety; no Chest pain.  Marland Kitchen antiseptic oral rinse  7 mL Mouth Rinse BID  . aspirin  81 mg Oral Daily  . atorvastatin  80 mg Oral q1800  . lisinopril  5 mg Oral Daily  . metoprolol tartrate  25 mg Oral BID  . ticagrelor  90 mg Oral BID      OBJECTIVE: Physical Exam: Filed Vitals:   01/11/14 1349 01/11/14 2000 01/12/14 0013 01/12/14 0400  BP: 107/81 103/68 101/77 104/74  Pulse: 60 65 62 60  Temp:  98.8 F (37.1 C) 98.2 F (36.8 C) 97.8 F (36.6 C)  TempSrc:      Resp: 16 18 16 16   Height:      Weight:      SpO2: 97% 97% 97% 98%    Intake/Output Summary (Last 24 hours) at 01/12/14 0802 Last data filed at 01/11/14 0900  Gross per 24 hour  Intake    240 ml  Output      0 ml  Net    240 ml    Telemetry reveals sinus rhythm  GEN- The patient is well appearing, alert and oriented x 3 today.   Head- normal Neck- supple Lungs- CTA CV RRRl GI- soft, NT, ND Extremities- no edema, R wrist access site looks good Neuro- strength and sensation are intact  LABS: Basic Metabolic Panel:  Recent Labs  01/10/14 0735 01/11/14 0243  NA 135* 138  K 4.2 4.9  CL 98 100  CO2 17* 27  GLUCOSE 169* 110*  BUN 15 10  CREATININE 1.01 1.06  CALCIUM 9.0 9.3   Liver Function Tests:  Recent Labs  01/10/14 0735  AST 38*  ALT 55*  ALKPHOS 69  BILITOT 0.5  PROT 6.9  ALBUMIN 4.0    Recent Labs  01/10/14 0735  LIPASE 403*   CBC:  Recent Labs  01/10/14 0735 01/10/14 1840 01/11/14 0243  WBC 6.0  --  10.2  HGB 16.9  --  16.3  HCT 47.8  --  48.1  MCV 87.7  --  90.8  PLT 217 215 221   Cardiac Enzymes:  Recent Labs  01/10/14 1235 01/10/14 1630 01/10/14 2350  TROPONINI >20.00* >20.00* >20.00*   RADIOLOGY: Dg Chest 2 View  01/10/2014   CLINICAL DATA:  Chest pain  EXAM: CHEST  2 VIEW  COMPARISON:  None.  FINDINGS: The heart size and mediastinal contours are within normal limits. Both lungs are clear. The  visualized skeletal structures are unremarkable.  IMPRESSION: No acute cardiopulmonary process.   Electronically Signed   By: Lovey Newcomer M.D.   On: 01/10/2014 08:33    ASSESSMENT AND PLAN:  Principal Problem:   Acute MI anterior wall first episode care Active Problems:   Acute anterior wall MI  1. Acute coronary syndrome/ST elevation anterior myocardial infarction due to thrombotic occlusion of the mid LAD treated with drug-eluting stent with resultant 0% stenosis and TIMI grade 3 flow as outlined above.  Doing well this am Continue ASA, statin, brilinta and ACEI; change lopressor to toprol 50 mg daily.  2. Ischemic CM Will need outpatient echo in 3 months to reassess LV function  3. HTN Controlled  4. HL Continue statin  DC today and fu with Dr Tamala Julian > 30 min PA and physician time D2    Kirk Ruths, MD 01/12/2014 8:02 AM

## 2014-01-14 ENCOUNTER — Telehealth: Payer: Self-pay | Admitting: Interventional Cardiology

## 2014-01-14 LAB — POCT ACTIVATED CLOTTING TIME: Activated Clotting Time: 574 seconds

## 2014-01-14 MED FILL — Sodium Chloride IV Soln 0.9%: INTRAVENOUS | Qty: 50 | Status: AC

## 2014-01-14 NOTE — Telephone Encounter (Signed)
returned pt call.pt sts that he has been doinfg well since Stemi on 9/4 pt denies chest pain, sob. 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 Message fwd to him

## 2014-01-14 NOTE — Telephone Encounter (Signed)
New Message  Pt called to discuss if and when he should return to work.. Please call back to discuss

## 2014-01-15 NOTE — Telephone Encounter (Signed)
I left a message for him to call back. RTW should be 10 days post MI if he feels well and can control exposure to stress and take breaks as required. 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

## 2014-01-22 ENCOUNTER — Ambulatory Visit (INDEPENDENT_AMBULATORY_CARE_PROVIDER_SITE_OTHER): Payer: 59 | Admitting: Physician Assistant

## 2014-01-22 ENCOUNTER — Encounter: Payer: Self-pay | Admitting: Physician Assistant

## 2014-01-22 ENCOUNTER — Encounter: Payer: Self-pay | Admitting: *Deleted

## 2014-01-22 VITALS — BP 118/72 | HR 61 | Ht 65.0 in | Wt 172.4 lb

## 2014-01-22 DIAGNOSIS — I209 Angina pectoris, unspecified: Secondary | ICD-10-CM

## 2014-01-22 DIAGNOSIS — I25709 Atherosclerosis of coronary artery bypass graft(s), unspecified, with unspecified angina pectoris: Secondary | ICD-10-CM

## 2014-01-22 DIAGNOSIS — I2109 ST elevation (STEMI) myocardial infarction involving other coronary artery of anterior wall: Secondary | ICD-10-CM

## 2014-01-22 DIAGNOSIS — I25810 Atherosclerosis of coronary artery bypass graft(s) without angina pectoris: Secondary | ICD-10-CM

## 2014-01-22 NOTE — Assessment & Plan Note (Signed)
Patient had large anterior wall MI treated with drug-eluting stent to the LAD on 01/10/14. He has LV dysfunction with apical and inferior apical severe hypokinesis EF 40%. Hopefully this will improve. Continue current treatment with Brilinta, aspirin, Lipitor, lisinopril, metoprolol. Followup with Dr. Tamala Julian in 2 months. Lipid panel and LFTs in 4-6 weeks. Patient may return to work part-time next Monday.

## 2014-01-22 NOTE — Patient Instructions (Signed)
Your physician recommends that you continue on your current medications as directed. Please refer to the Current Medication list given to you today.   Your physician recommends that you schedule a follow-up appointment in: [facility] IN 2 MONTHS    Your physician recommends that you return for lab work in: IN 4 TO 6 [place] LFT

## 2014-01-22 NOTE — Progress Notes (Signed)
HPI: This is a 58 year old male patient of Dr. Pernell Dupre who presented with an anterior wall STEMI treated with drug-eluting stent to the LAD. He had widely patent circumflex and RCA. His LV dysfunction with apical and inferior apical severe hypokinesis EF 40%. He was treated with aspirin, Brilinta, lisinopril, Toprol, and statin.  Patient comes in today feeling quite well. He is walking 15 minutes twice a day without chest pain. He has noticed some dyspnea on exertion. He says his job is quite a stressful environment. He denies palpitations, dizziness, edema or presyncope.  No Known Allergies   Current Outpatient Prescriptions  Medication Sig Dispense Refill  . aspirin 81 MG chewable tablet Chew 1 tablet (81 mg total) by mouth daily.      Marland Kitchen atorvastatin (LIPITOR) 80 MG tablet Take 1 tablet (80 mg total) by mouth daily at 6 PM.  30 tablet  5  . Cholecalciferol (VITAMIN D) 2000 UNITS tablet Take 2,000 Units by mouth daily.      Marland Kitchen lisinopril (PRINIVIL,ZESTRIL) 5 MG tablet Take 1 tablet (5 mg total) by mouth daily.  30 tablet  5  . metoprolol succinate (TOPROL-XL) 50 MG 24 hr tablet Take 1 tablet (50 mg total) by mouth daily. Take with or immediately following a meal.  30 tablet  5  . nitroGLYCERIN (NITROSTAT) 0.4 MG SL tablet Place 1 tablet (0.4 mg total) under the tongue every 5 (five) minutes as needed for chest pain.  25 tablet  12  . ticagrelor (BRILINTA) 90 MG TABS tablet Take 1 tablet (90 mg total) by mouth 2 (two) times daily.  60 tablet  10   No current facility-administered medications for this visit.    No past medical history on file.  Past Surgical History  Procedure Laterality Date  . None      No family history on file.  History   Social History  . Marital Status: Married    Spouse Name: N/A    Number of Children: N/A  . Years of Education: N/A   Occupational History  . Not on file.   Social History Main Topics  . Smoking status: Former Smoker   Types: Cigarettes  . Smokeless tobacco: Never Used  . Alcohol Use: 2.4 oz/week    4 Cans of beer per week  . Drug Use: No  . Sexual Activity: Not on file   Other Topics Concern  . Not on file   Social History Narrative  . No narrative on file    ROS: See history of present illness otherwise negative  BP 118/72  Pulse 61  Ht 5\' 5"  (1.651 m)  Wt 172 lb 6.4 oz (78.2 kg)  BMI 28.69 kg/m2  PHYSICAL EXAM: Well-nournished, in no acute distress. Neck: No JVD, HJR, Bruit, or thyroid enlargement  Lungs: No tachypnea, clear without wheezing, rales, or rhonchi  Cardiovascular: RRR, PMI not displaced, positive S4, no murmur, bruit, thrill, or heave.  Abdomen: BS normal. Soft without organomegaly, masses, lesions or tenderness.  Extremities: Right arm without hematoma at cath site good radial and brachial pulses, lower extremities without cyanosis, clubbing or edema. Good distal pulses bilateral  SKin: Warm, no lesions or rashes   Musculoskeletal: No deformities  Neuro: no focal signs   Wt Readings from Last 3 Encounters:  01/10/14 164 lb 3.9 oz (74.5 kg)  01/10/14 164 lb 3.9 oz (74.5 kg)     EKG: Normal sinus rhythm with deep T wave inversion anterior lateral  Cardiac catheterization 01/10/14: IMPRESSIONS: 1. Acute coronary syndrome/ST elevation anterior myocardial infarction due to thrombotic occlusion of the mid LAD treated with drug-eluting stent with resultant 0% stenosis and TIMI grade 3 flow as outlined above.   2. Widely patent circumflex and right coronary   3. Left ventricular dysfunction with apical and inferoapical severe hypokinesis. LVEF 40%   4. Time to reperfusion was increased due to ambiguous EKG, that in retrospect was highly suggestive of ST elevation MI given the patient's complaints.   RECOMMENDATION: Aspirin and Brilinta   Beta blocker therapy   ACE inhibitor therapy   Statin therapy   Potential candidate for early discharge on Sunday morning assuming  no arrhythmias, heart failure, recurrent ischemia, or mechanical complications.   Aggrastat bolus given at completion of case the thrombus burden.

## 2014-01-27 ENCOUNTER — Telehealth: Payer: Self-pay | Admitting: Interventional Cardiology

## 2014-01-27 NOTE — Telephone Encounter (Signed)
New Message  Pt called requests a call back to discuss retrieving a letter indicating he is clear to work full time. He has a letter informing his employer that it is safe to work part time. However, they are requiring additional letters confirming its ok to work full time. Please call.

## 2014-01-27 NOTE — Telephone Encounter (Signed)
**Note De-Identified Denaly Gatling Obfuscation** The pt states that he needs a letter stating that he can work full time by this Friday. Please address.

## 2014-01-29 NOTE — Telephone Encounter (Signed)
Routed to [place] for ok

## 2014-01-30 NOTE — Telephone Encounter (Signed)
Follow up:    Pt is waiting to here when he can go back to work full time.   Per pt he needs to know bay Friday 9/25 to start by 9/28.   Please give pt a call back.

## 2014-01-30 NOTE — Telephone Encounter (Signed)
Lmom. Letter for pt to return to work full time and [employer] paperwork signed and left at front desk for pt to pick up

## 2014-01-31 ENCOUNTER — Encounter: Payer: 59 | Admitting: Cardiology

## 2014-03-05 ENCOUNTER — Other Ambulatory Visit (INDEPENDENT_AMBULATORY_CARE_PROVIDER_SITE_OTHER): Payer: 59 | Admitting: *Deleted

## 2014-03-05 DIAGNOSIS — I25709 Atherosclerosis of coronary artery bypass graft(s), unspecified, with unspecified angina pectoris: Secondary | ICD-10-CM

## 2014-03-05 LAB — LIPID PANEL
Cholesterol: 89 mg/dL (ref 0–200)
HDL: 36.1 mg/dL — ABNORMAL LOW (ref >39.00)
LDL Cholesterol: 21 mg/dL (ref 0–99)
NonHDL: 52.9
Total CHOL/HDL Ratio: 2
Triglycerides: 162 mg/dL — ABNORMAL HIGH (ref 0.0–149.0)
VLDL: 32.4 mg/dL (ref 0.0–40.0)

## 2014-03-05 LAB — HEPATIC FUNCTION PANEL
ALT: 56 U/L — ABNORMAL HIGH (ref 0–53)
AST: 36 U/L (ref 0–37)
Albumin: 3.6 g/dL (ref 3.5–5.2)
Alkaline Phosphatase: 59 U/L (ref 39–117)
Bilirubin, Direct: 0 mg/dL (ref 0.0–0.3)
Total Bilirubin: 0.8 mg/dL (ref 0.2–1.2)
Total Protein: 6.5 g/dL (ref 6.0–8.3)

## 2014-03-07 ENCOUNTER — Encounter: Payer: Self-pay | Admitting: Cardiology

## 2014-03-07 ENCOUNTER — Other Ambulatory Visit: Payer: Self-pay | Admitting: *Deleted

## 2014-03-07 MED ORDER — ATORVASTATIN CALCIUM 20 MG PO TABS: 20.0000 mg | ORAL_TABLET | Freq: Every day | ORAL | Status: DC

## 2014-03-07 NOTE — Telephone Encounter (Signed)
This encounter was created in error - please disregard.

## 2014-03-07 NOTE — Telephone Encounter (Signed)
Pt was returning Debra's call in regards to some blood work results. Please call  Thanks

## 2014-03-18 ENCOUNTER — Encounter: Payer: Self-pay | Admitting: Interventional Cardiology

## 2014-04-09 ENCOUNTER — Ambulatory Visit: Payer: 59 | Admitting: Interventional Cardiology

## 2014-04-17 ENCOUNTER — Encounter (HOSPITAL_COMMUNITY): Payer: Self-pay | Admitting: Interventional Cardiology

## 2014-05-07 ENCOUNTER — Encounter: Payer: Self-pay | Admitting: *Deleted

## 2014-05-13 ENCOUNTER — Encounter: Payer: Self-pay | Admitting: Interventional Cardiology

## 2014-05-13 ENCOUNTER — Ambulatory Visit (INDEPENDENT_AMBULATORY_CARE_PROVIDER_SITE_OTHER): Payer: 59 | Admitting: Interventional Cardiology

## 2014-05-13 VITALS — BP 116/70 | HR 58 | Ht 65.0 in | Wt 162.8 lb

## 2014-05-13 DIAGNOSIS — E785 Hyperlipidemia, unspecified: Secondary | ICD-10-CM

## 2014-05-13 DIAGNOSIS — Z955 Presence of coronary angioplasty implant and graft: Secondary | ICD-10-CM

## 2014-05-13 DIAGNOSIS — I5041 Acute combined systolic (congestive) and diastolic (congestive) heart failure: Secondary | ICD-10-CM

## 2014-05-13 DIAGNOSIS — I2510 Atherosclerotic heart disease of native coronary artery without angina pectoris: Secondary | ICD-10-CM | POA: Insufficient documentation

## 2014-05-13 DIAGNOSIS — Z9861 Coronary angioplasty status: Secondary | ICD-10-CM

## 2014-05-13 DIAGNOSIS — I2109 ST elevation (STEMI) myocardial infarction involving other coronary artery of anterior wall: Secondary | ICD-10-CM

## 2014-05-13 NOTE — Progress Notes (Signed)
Patient ID: Brian Dickerson, male   DOB: 10-16-1955, 59 y.o.   MRN: [id number]    [address] [place], [place]  [address] Phone: [phone] Fax:  [phone]  Date:  05/13/2014   ID:  Brian Dickerson, DOB February 01, 1956, MRN [id number]  PCP:  Cammy Copa, MD   ASSESSMENT:  1. Native coronary artery disease with LAD stent, asymptomatic 2. Hyperlipidemia, on Lipitor 20 mg per day. Current status not known 3. Combined systolic and diastolic heart failure, LVEF post MI at 40%  PLAN:  1. Echocardiogram to reassess LV function. If LV function has normalized we will likely discontinue ACE inhibitor therapy and continue only low-dose beta blocker 2. No change in the current medical regimen 3. Fasting statin panel 4. Clinical follow-up in 6 months   SUBJECTIVE: [name] is a 59 y.o. male who is doing well. He has not had chest discomfort or dyspnea. No medication side effects. He states that after hospital discharge he had an office visit with Estella Husk. A statin panel was ordered. His LDL was 21. High intensity statin was decreased to Lipitor 20 mg per day. He denies muscle soreness, orthopnea, PND, palpitations, syncope, and DOE.   Wt Readings from Last 3 Encounters:  05/13/14 162 lb 12.8 oz (73.846 kg)  01/22/14 172 lb 6.4 oz (78.2 kg)  01/10/14 164 lb 3.9 oz (74.5 kg)     Past Medical History  Diagnosis Date  . Acute MI   . HLD (hyperlipidemia)   . HTN (hypertension)     Current Outpatient Prescriptions  Medication Sig Dispense Refill  . aspirin 81 MG chewable tablet Chew 1 tablet (81 mg total) by mouth daily.    Marland Kitchen atorvastatin (LIPITOR) 20 MG tablet Take 1 tablet (20 mg total) by mouth daily at 6 PM. 30 tablet 12  . Cholecalciferol (VITAMIN D) 2000 UNITS tablet Take 2,000 Units by mouth daily.    Marland Kitchen lisinopril (PRINIVIL,ZESTRIL) 5 MG tablet Take 1 tablet (5 mg total) by mouth daily. 30 tablet 5  . metoprolol succinate (TOPROL-XL) 50 MG 24  hr tablet Take 1 tablet (50 mg total) by mouth daily. Take with or immediately following a meal. 30 tablet 5  . nitroGLYCERIN (NITROSTAT) 0.4 MG SL tablet Place 1 tablet (0.4 mg total) under the tongue every 5 (five) minutes as needed for chest pain. 25 tablet 12  . ticagrelor (BRILINTA) 90 MG TABS tablet Take 1 tablet (90 mg total) by mouth 2 (two) times daily. 60 tablet 10   No current facility-administered medications for this visit.    Allergies:   No Known Allergies  Social History:  The patient  reports that he has quit smoking. His smoking use included Cigarettes. He smoked 0.00 packs per day. He has never used smokeless tobacco. He reports that he drinks about 2.4 oz of alcohol per week. He reports that he does not use illicit drugs.   ROS:  Please see the history of present illness.   Erectile dysfunction has resolved. No transient neurological complaints.   All other systems reviewed and negative.   OBJECTIVE: VS:  BP 116/70 mmHg  Pulse 58  Ht 5\' 5"  (1.651 m)  Wt 162 lb 12.8 oz (73.846 kg)  BMI 27.09 kg/m2  SpO2 99% Well nourished, well developed, in no acute distress, healthy appearing HEENT: normal Neck: JVD flat. Carotid bruit absent  Cardiac:  normal S1, S2; RRR; no murmur Lungs:  clear to auscultation bilaterally, no wheezing, rhonchi or rales  Abd: soft, nontender, no hepatomegaly Ext: Edema absent. Pulses 2+ Skin: warm and dry Neuro:  CNs 2-12 intact, no focal abnormalities noted  EKG:  Not performed       Signed, Illene Labrador III, MD 05/13/2014 11:21 AM

## 2014-05-13 NOTE — Patient Instructions (Signed)
Your physician recommends that you continue on your current medications as directed. Please refer to the Current Medication list given to you today.  Your physician has requested that you have an echocardiogram. Echocardiography is a painless test that uses sound waves to create images of your heart. It provides your doctor with information about the size and shape of your heart and how well your heart's chambers and valves are working. This procedure takes approximately one hour. There are no restrictions for this procedure.  Your physician recommends that you return for a FASTING lipid profile: on Wednesday, May 14, 2014.  Lab hours are 7:30AM- 5:15PM.  Your physician wants you to follow-up in: 6 months with Dr. Tamala Julian. You will receive a reminder letter in the mail two months in advance. If you don't receive a letter, please call our office to schedule the follow-up appointment.

## 2014-05-14 ENCOUNTER — Other Ambulatory Visit: Payer: 59

## 2014-05-15 ENCOUNTER — Ambulatory Visit (HOSPITAL_COMMUNITY): Payer: 59 | Attending: Cardiology

## 2014-05-15 ENCOUNTER — Other Ambulatory Visit (INDEPENDENT_AMBULATORY_CARE_PROVIDER_SITE_OTHER): Payer: 59 | Admitting: *Deleted

## 2014-05-15 DIAGNOSIS — I2510 Atherosclerotic heart disease of native coronary artery without angina pectoris: Secondary | ICD-10-CM | POA: Insufficient documentation

## 2014-05-15 DIAGNOSIS — I252 Old myocardial infarction: Secondary | ICD-10-CM | POA: Diagnosis not present

## 2014-05-15 DIAGNOSIS — I081 Rheumatic disorders of both mitral and tricuspid valves: Secondary | ICD-10-CM | POA: Insufficient documentation

## 2014-05-15 DIAGNOSIS — I5040 Unspecified combined systolic (congestive) and diastolic (congestive) heart failure: Secondary | ICD-10-CM | POA: Insufficient documentation

## 2014-05-15 DIAGNOSIS — Z87891 Personal history of nicotine dependence: Secondary | ICD-10-CM | POA: Diagnosis not present

## 2014-05-15 DIAGNOSIS — E785 Hyperlipidemia, unspecified: Secondary | ICD-10-CM | POA: Diagnosis not present

## 2014-05-15 DIAGNOSIS — I5041 Acute combined systolic (congestive) and diastolic (congestive) heart failure: Secondary | ICD-10-CM

## 2014-05-15 LAB — LIPID PANEL
CHOL/HDL RATIO: 3
CHOLESTEROL: 98 mg/dL (ref 0–200)
HDL: 36.7 mg/dL — ABNORMAL LOW (ref >39.00)
LDL Cholesterol: 31 mg/dL (ref 0–99)
NonHDL: 61.3
TRIGLYCERIDES: 150 mg/dL — AB (ref 0.0–149.0)
VLDL: 30 mg/dL (ref 0.0–40.0)

## 2014-05-15 LAB — ALT: ALT: 36 U/L (ref 0–53)

## 2014-05-15 NOTE — Progress Notes (Signed)
2D Echo completed. 05/15/2014

## 2014-05-16 ENCOUNTER — Other Ambulatory Visit: Payer: Self-pay

## 2014-05-16 ENCOUNTER — Telehealth: Payer: Self-pay | Admitting: Interventional Cardiology

## 2014-05-21 ENCOUNTER — Telehealth: Payer: Self-pay | Admitting: Interventional Cardiology

## 2014-05-21 DIAGNOSIS — E785 Hyperlipidemia, unspecified: Secondary | ICD-10-CM

## 2014-05-21 NOTE — Telephone Encounter (Signed)
New Msg ° ° ° ° °Pt returning call, please call back. °

## 2014-05-21 NOTE — Telephone Encounter (Signed)
-----   Message from Sinclair Grooms, MD sent at 05/20/2014  6:03 PM EST ----- Decrease lipitor to 10 mg daily. Recheck lipid and liver panel in 6 months.

## 2014-05-21 NOTE — Telephone Encounter (Signed)
returned pt call. lmtcb 

## 2014-05-21 NOTE — Telephone Encounter (Signed)
Called to give pt lab results and Dr.Smith's recommendation. lmtcb 

## 2014-05-23 MED ORDER — ATORVASTATIN CALCIUM 20 MG PO TABS: 10.0000 mg | ORAL_TABLET | Freq: Every day | ORAL | Status: DC

## 2014-05-23 NOTE — Telephone Encounter (Signed)
Pt aware of lab results and Dr.Smiths recommendation.Decrease lipitor to 10mg  daily. repeat lipid and alt in 6 months.adv him we will mail a reminder letter for him to call the office to schedule his lab appt. Pt verbalized understanding.

## 2014-05-29 ENCOUNTER — Telehealth: Payer: Self-pay | Admitting: Interventional Cardiology

## 2014-05-29 NOTE — Telephone Encounter (Signed)
New Msg       1. What dental office are you calling from? Pt calling, office is [facility] on [address]   2. What is your office phone and fax number? [phone]  3. What type of procedure is the patient having performed? Cleaning   4. What date is procedure scheduled? Previously scheduled but office refused cleaning, until further notice.  5. What is your question (ex. Antibiotics prior to procedure, holding medication-we need to know how long dentist wants pt to hold med)? Not to his knowledge   Pt calling to see what needs to be done in order to get his teeth cleaned.  Does he need a note to go or is he ok? Office is requesting note due to heart attack.

## 2014-06-02 NOTE — Telephone Encounter (Signed)
Spoke with pt who sts that he dentist office advise him that he cannot have his teeth cleaned until 6 months post MI, unless they receive a clearance from his cardiololgist.  Called and spoke with pt dentist [facility] ph # [phone]. They need in writing ok for pt to have a dental cleaning, that pt does not need prophylaxis antibiotic and does not need to hold Brilinta for a routine cleaning. Routed to [place]

## 2014-06-02 NOTE — Telephone Encounter (Signed)
returned pt call. lmtcb 

## 2014-06-03 NOTE — Telephone Encounter (Signed)
It would be okay for the patient to have dental cleaning prior to 6 months after his MI but I will have to leave that to the comfort zone of the dentist.

## 2014-06-03 NOTE — Telephone Encounter (Signed)
Will fax Dr.Smith response to [facility]

## 2014-06-11 ENCOUNTER — Telehealth: Payer: Self-pay | Admitting: Interventional Cardiology

## 2014-06-11 NOTE — Telephone Encounter (Signed)
Please disregard.Marland KitchenMarland KitchenMarland KitchenMarland Kitchenerror msg

## 2014-06-11 NOTE — Telephone Encounter (Signed)
Returned pt call.[email or web address] 

## 2014-06-11 NOTE — Telephone Encounter (Signed)
F/U       Pt returning call about teeth cleaning.    Requested to be called at [phone].

## 2014-06-12 NOTE — Telephone Encounter (Signed)
lmom at pt request.per Dr.Smith It would be okay for the patient to have dental cleaning prior to 6 months after his MI but I will have to leave that to the comfort zone of the dentist. That message has been fwd to his dentist office

## 2014-06-12 NOTE — Telephone Encounter (Signed)
F/u    Pt want to know if you are going to sent a letter to his dentist. Pt stated please leave message if he don't pick up.  Please call pt at this number [phone]

## 2014-07-14 ENCOUNTER — Other Ambulatory Visit: Payer: Self-pay

## 2014-07-14 MED ORDER — METOPROLOL SUCCINATE ER 50 MG PO TB24: 50.0000 mg | ORAL_TABLET | Freq: Every day | ORAL | Status: DC

## 2014-07-14 NOTE — Telephone Encounter (Signed)
Belva Crome III, MD at 05/13/2014 11:21 AM  metoprolol succinate (TOPROL-XL) 50 MG 24 hr tabletTake 1 tablet (50 mg total) by mouth daily. Take with or immediately following a meal Patient Instructions:   Your physician recommends that you continue on your current medications as directed. Please refer to the Current Medication list given to you today.

## 2015-07-30 ENCOUNTER — Ambulatory Visit (INDEPENDENT_AMBULATORY_CARE_PROVIDER_SITE_OTHER): Payer: 59 | Admitting: Interventional Cardiology

## 2015-07-30 ENCOUNTER — Encounter: Payer: Self-pay | Admitting: Interventional Cardiology

## 2015-07-30 VITALS — BP 130/84 | HR 66 | Ht 65.0 in | Wt 188.2 lb

## 2015-07-30 DIAGNOSIS — E785 Hyperlipidemia, unspecified: Secondary | ICD-10-CM | POA: Diagnosis not present

## 2015-07-30 DIAGNOSIS — I2510 Atherosclerotic heart disease of native coronary artery without angina pectoris: Secondary | ICD-10-CM

## 2015-07-30 DIAGNOSIS — I5032 Chronic diastolic (congestive) heart failure: Secondary | ICD-10-CM

## 2015-07-30 NOTE — Addendum Note (Signed)
Addended by: Eulis Foster on: 07/30/2015 08:48 AM   Modules accepted: Orders

## 2015-07-30 NOTE — Progress Notes (Signed)
Cardiology Office Note   Date:  07/30/2015   ID:  Brian Dickerson, DOB Mar 22, 1956, MRN [id number]  PCP:  Cammy Copa, MD  Cardiologist:  Sinclair Grooms, MD   Chief Complaint  Patient presents with  . Coronary Artery Disease      History of Present Illness: Brian Dickerson is a 60 y.o. male who presents for CAD follow-up. Had an acute anterior myocardial infarction in 2015. Last seen in early 2016. Brian Dickerson is doing well. He denies any cardiopulmonary complaints. He is not exercising on a regular basis. No medication side effects. He is off therapy. We discontinued atorvastatin because of low total and LDL cholesterol levels.    Past Medical History  Diagnosis Date  . Acute MI ([place])   . HLD (hyperlipidemia)   . HTN (hypertension)     Past Surgical History  Procedure Laterality Date  . None    . Left heart catheterization with coronary angiogram N/A 01/10/2014    Procedure: LEFT HEART CATHETERIZATION WITH CORONARY ANGIOGRAM;  Surgeon: Sinclair Grooms, MD;  Location[facility] CATH LAB;  Service: Cardiovascular;  Laterality: N/A;     Current Outpatient Prescriptions  Medication Sig Dispense Refill  . aspirin 81 MG chewable tablet Chew 1 tablet (81 mg total) by mouth daily.    . Cholecalciferol (VITAMIN D) 2000 UNITS tablet Take 2,000 Units by mouth daily.    . nitroGLYCERIN (NITROSTAT) 0.4 MG SL tablet Place 1 tablet (0.4 mg total) under the tongue every 5 (five) minutes as needed for chest pain. 25 tablet 12   No current facility-administered medications for this visit.    Allergies:   Review of patient's allergies indicates no known allergies.    Social History:  The patient  reports that he has quit smoking. His smoking use included Cigarettes. He has never used smokeless tobacco. He reports that he drinks about 2.4 oz of alcohol per week. He reports that he does not use illicit drugs.   Family History:  The patient's family history is not on file.     ROS:  Please see the history of present illness.   Otherwise, review of systems are positive for Fleeting atypical chest discomfort, nonexertional..   All other systems are reviewed and negative.    PHYSICAL EXAM: VS:  BP 130/84 mmHg  Pulse 66  Ht 5\' 5"  (1.651 m)  Wt 188 lb 3.2 oz (85.367 kg)  BMI 31.32 kg/m2 , BMI Body mass index is 31.32 kg/(m^2). GEN: Well nourished, well developed, in no acute distress HEENT: normal Neck: no JVD, carotid bruits, or masses Cardiac: RRR.  There is no murmur, rub, or gallop. There is no edema. Respiratory:  clear to auscultation bilaterally, normal work of breathing. GI: soft, nontender, nondistended, + BS MS: no deformity or atrophy Skin: warm and dry, no rash Neuro:  Strength and sensation are intact Psych: euthymic mood, full affect   EKG:  EKG is ordered today. The ekg reveals normal with relatively short PR interval   Recent Labs: No results found for requested labs within last 365 days.    Lipid Panel    Component Value Date/Time   CHOL 98 05/15/2014 0800   TRIG 150.0* 05/15/2014 0800   HDL 36.70* 05/15/2014 0800   CHOLHDL 3 05/15/2014 0800   VLDL 30.0 05/15/2014 0800   LDLCALC 31 05/15/2014 0800      Wt Readings from Last 3 Encounters:  07/30/15 188 lb 3.2 oz (85.367 kg)  05/13/14 162 lb 12.8  oz (73.846 kg)  01/22/14 172 lb 6.4 oz (78.2 kg)      Other studies Reviewed: Additional studies/ records that were reviewed today include: Laboratory data. The findings include response to statin therapy was excessive when using 10 mg of atorvastatin and LDL was less than 30..    ASSESSMENT AND PLAN:  1. Coronary artery disease involving native coronary artery of native heart without angina pectoris Asymptomatic - EKG 12-Lead  2. Hyperlipidemia Not recently evaluated. With relatively low total and LDL cholesterol, will do more advanced evaluation to make sure we are not missing a problem that needs therapy. Currently on  no treatment. - Cardio IQ (R) ST2, Soluble  3. Chronic diastolic heart failure (HCC) Stable without dyspnea. - EKG 12-Lead    Current medicines are reviewed at length with the patient today.  The patient has the following concerns regarding medicines: None.  The following changes/actions have been instituted:    Cardio IQ to evaluate particle size and number. Also will help [place] identify their other markers at place of any increased risk.  Labs/ tests ordered today include:  Orders Placed This Encounter  Procedures  . Cardio IQ (R) ST2, Soluble  . EKG 12-Lead     Disposition:   FU with HS in 1 year  Signed, Sinclair Grooms, MD  07/30/2015 8:40 AM    [facility] [address], [place], [place]  [address] Phone: [phone]; Fax: [phone]

## 2015-07-30 NOTE — Addendum Note (Signed)
Addended by: Eulis Foster on: 07/30/2015 08:49 AM   Modules accepted: Orders

## 2015-07-30 NOTE — Patient Instructions (Signed)
Medication Instructions:  Your physician recommends that you continue on your current medications as directed. Please refer to the Current Medication list given to you today.   Labwork: Cardio IQ today  Testing/Procedures: None ordered  Follow-Up: Your physician wants you to follow-up in: 1 year with Dr.Smith You will receive a reminder letter in the mail two months in advance. If you don't receive a letter, please call our office to schedule the follow-up appointment.   Any Other Special Instructions Will Be Listed Below (If Applicable). Your physician discussed the importance of regular exercise and recommended that you start or continue a regular exercise program for good health.  Your blood pressure goal is to be < 130/85  Your cholesterol goal total <170, LDL <70    If you need a refill on your cardiac medications before your next appointment, please call your pharmacy.

## 2015-08-03 LAB — CARDIO IQ(R) ADVANCED LIPID PANEL
Apolipoprotein B: 82 mg/dL (ref 52–109)
CHOLESTEROL/HDL RATIO (CARDIO IQ ADV LIPID PANEL): 3.9 calc (ref <=5.0)
Cholesterol, Total: 199 mg/dL (ref 125–200)
HDL Cholesterol: 51 mg/dL (ref >=40)
LDL Large: 8300 nmol/L (ref 4334–10815)
LDL MEDIUM: 273 nmol/L (ref 167–465)
LDL Particle Number: 1370 nmol/L (ref 1016–2185)
LDL Peak Size: 216.8 Angstrom — ABNORMAL LOW (ref >=218.2)
LDL SMALL: 261 nmol/L (ref 123–441)
LDL, Calculated: 107 mg/dL
Lipoprotein (a): 29 nmol/L (ref <75)
Non-HDL Cholesterol: 148 mg/dL
TRIGLYCERIDES (CARDIO IQ ADV LIPID PANEL): 206 mg/dL — AB

## 2015-08-11 ENCOUNTER — Telehealth: Payer: Self-pay | Admitting: Interventional Cardiology

## 2015-08-11 NOTE — Telephone Encounter (Signed)
Returning your call. Please call at [phone].. Thanks

## 2015-08-12 NOTE — Telephone Encounter (Signed)
Returned pt call. Called to give pt lab results and Dr.Smith's recommendations. [email or web address]

## 2015-08-14 ENCOUNTER — Other Ambulatory Visit: Payer: Self-pay | Admitting: *Deleted

## 2015-08-14 DIAGNOSIS — E785 Hyperlipidemia, unspecified: Secondary | ICD-10-CM

## 2015-08-14 DIAGNOSIS — I2510 Atherosclerotic heart disease of native coronary artery without angina pectoris: Secondary | ICD-10-CM

## 2015-08-14 MED ORDER — ATORVASTATIN CALCIUM 10 MG PO TABS: 10.0000 mg | ORAL_TABLET | Freq: Every day | ORAL | Status: DC

## 2015-08-14 NOTE — Telephone Encounter (Signed)
Left a message for the pt to call back to endorse lab results and recommendations per Dr Tamala Julian, as mentioned below.       Notes Recorded by Belva Crome, MD on 08/06/2015 at 6:22 PM The advanced lipid panel is abnormal with high concentration and particle number of small LDL. This is an atherogenic picture and suggests that therapy should be started. I would recommend atorvastatin 10 mg per day or Crestor 5 mg daily. The Cardiolite Q should then be repeated in 6-[redacted] weeks along with a liver panel

## 2015-08-14 NOTE — Telephone Encounter (Signed)
Notified of lab results.  He will try Atorvastatin 10 mg.  Rx sent to [facility].  Set up for Cardio IQ (R) adv. Lipid panel and liver panel for 10/07/15.

## 2015-08-14 NOTE — Telephone Encounter (Signed)
Follow up ° ° ° ° ° °Returning a call to the nurse to get lab results °

## 2015-08-14 NOTE — Telephone Encounter (Signed)
[phone] calling re lab results-pls call back

## 2015-10-07 ENCOUNTER — Other Ambulatory Visit: Payer: 59

## 2015-10-08 ENCOUNTER — Other Ambulatory Visit: Payer: Self-pay

## 2015-10-08 ENCOUNTER — Other Ambulatory Visit (INDEPENDENT_AMBULATORY_CARE_PROVIDER_SITE_OTHER): Payer: 59 | Admitting: *Deleted

## 2015-10-08 DIAGNOSIS — E785 Hyperlipidemia, unspecified: Secondary | ICD-10-CM

## 2015-10-08 DIAGNOSIS — I2510 Atherosclerotic heart disease of native coronary artery without angina pectoris: Secondary | ICD-10-CM

## 2015-10-08 LAB — HEPATIC FUNCTION PANEL
ALBUMIN: 4.1 g/dL (ref 3.6–5.1)
ALK PHOS: 67 U/L (ref 40–115)
ALT: 46 U/L (ref 9–46)
AST: 27 U/L (ref 10–35)
Bilirubin, Direct: 0.1 mg/dL (ref <=0.2)
Indirect Bilirubin: 0.3 mg/dL (ref 0.2–1.2)
TOTAL PROTEIN: 6.1 g/dL (ref 6.1–8.1)
Total Bilirubin: 0.4 mg/dL (ref 0.2–1.2)

## 2015-10-08 MED ORDER — ATORVASTATIN CALCIUM 10 MG PO TABS: 10.0000 mg | ORAL_TABLET | Freq: Every day | ORAL | Status: DC

## 2015-10-09 ENCOUNTER — Other Ambulatory Visit: Payer: Self-pay | Admitting: Interventional Cardiology

## 2015-10-09 MED ORDER — ATORVASTATIN CALCIUM 10 MG PO TABS: 10.0000 mg | ORAL_TABLET | Freq: Every day | ORAL | Status: DC

## 2015-10-09 NOTE — Telephone Encounter (Signed)
Pt request that a 30 day supply of Atorvastatin 10 mg tablet be sent to [facility], because pt will be out of the medication and that a 90 day supply of Atorvastatin 10 mg be sent to [facility] for mail order.

## 2015-10-12 LAB — CARDIO IQ(R) ADVANCED LIPID PANEL
Apolipoprotein B: 48 mg/dL — ABNORMAL LOW (ref 52–109)
CHOLESTEROL, TOTAL (CARDIO IQ ADV LIPID PANEL): 149 mg/dL (ref 125–200)
Cholesterol/HDL Ratio: 3.5 calc (ref <=5.0)
HDL Cholesterol: 43 mg/dL (ref >=40)
LDL LARGE: 4942 nmol/L (ref 4334–10815)
LDL MEDIUM: 109 nmol/L — AB (ref 167–465)
LDL PARTICLE NUMBER: 748 nmol/L — AB (ref 1016–2185)
LDL PEAK SIZE: 206.8 Angstrom — AB (ref >=218.2)
LDL SMALL: 116 nmol/L — AB (ref 123–441)
LDL, Calculated: 61 mg/dL
Lipoprotein (a): 27 nmol/L (ref <75)
Non-HDL Cholesterol: 106 mg/dL
Triglycerides: 225 mg/dL — ABNORMAL HIGH

## 2015-10-14 ENCOUNTER — Other Ambulatory Visit: Payer: Self-pay

## 2015-10-14 DIAGNOSIS — E785 Hyperlipidemia, unspecified: Secondary | ICD-10-CM

## 2016-04-11 ENCOUNTER — Telehealth: Payer: Self-pay

## 2016-04-11 ENCOUNTER — Telehealth: Payer: Self-pay | Admitting: Interventional Cardiology

## 2016-04-11 NOTE — Telephone Encounter (Signed)
Patient was directed to go to ED after having chest pain that was temporary relieved with nitro. Left message for patient to call back.   Patient called back. Patient stated he is feeling better. Encouraged patient to come in for an office visit. Patient will see a PA tomorrow morning.

## 2016-04-11 NOTE — Telephone Encounter (Signed)
Called patient about his chest pain. Patient stated he has been having chest pain since yesterday. Patient has not taken any nitro. Encouraged patient to take nitro while on the phone. Patient stated that his chest pain went away with the nitro after 2 minutes. Told patient I would call him back while I consulted Dr. Tamala Julian. Dr. Tamala Julian advised if patient's chest pain is gone with nitro then he can come in today, if not relieved with nitro, patient will need to go to ED. Called patient back. Patient stated his chest pain was back. Encouraged patient to take another nitro and call 911. Patient stated he would take himself to ED. Advised against taking himself to ED, and to at least have someone else take patient to ED. Patient verbalized understanding and would go to ED.

## 2016-04-11 NOTE — Telephone Encounter (Signed)
Pain is calling because he is experiencing Chest pain since yesterday. He is not taking any medication right now and wonder what he should do.

## 2016-04-12 ENCOUNTER — Encounter: Payer: Self-pay | Admitting: Physician Assistant

## 2016-04-12 ENCOUNTER — Ambulatory Visit (INDEPENDENT_AMBULATORY_CARE_PROVIDER_SITE_OTHER): Payer: 59 | Admitting: Physician Assistant

## 2016-04-12 VITALS — BP 138/82 | HR 65 | Ht 65.0 in | Wt 189.0 lb

## 2016-04-12 DIAGNOSIS — E785 Hyperlipidemia, unspecified: Secondary | ICD-10-CM

## 2016-04-12 DIAGNOSIS — I2510 Atherosclerotic heart disease of native coronary artery without angina pectoris: Secondary | ICD-10-CM

## 2016-04-12 DIAGNOSIS — R0989 Other specified symptoms and signs involving the circulatory and respiratory systems: Secondary | ICD-10-CM

## 2016-04-12 DIAGNOSIS — R0789 Other chest pain: Secondary | ICD-10-CM | POA: Diagnosis not present

## 2016-04-12 MED ORDER — PANTOPRAZOLE SODIUM 40 MG PO TBEC: 40.0000 mg | DELAYED_RELEASE_TABLET | Freq: Every day | ORAL | 0 refills | Status: DC

## 2016-04-12 MED ORDER — IBUPROFEN 600 MG PO TABS: 600.0000 mg | ORAL_TABLET | Freq: Three times a day (TID) | ORAL | 0 refills | Status: DC

## 2016-04-12 NOTE — Patient Instructions (Signed)
Medication Instructions:   TAKE IBUPROFEN 600 MG THREE TIMES DAILY WITH MEALS FOR 2 WEEKS ONLY  TAKE PROTONIX 40 MG BY MOUTH DAILY FOR 30 DAYS ONLY    Follow-Up:  2 MONTHS WITH DR Tamala Julian     If you need a refill on your cardiac medications before your next appointment, please call your pharmacy.

## 2016-04-12 NOTE — Progress Notes (Signed)
Cardiology Office Note    Date:  04/12/2016   ID:  Keonte Salzer, DOB Feb 24, 1956, MRN [id number]  PCP:  Cammy Copa, MD  Cardiologist:  Dr. Tamala Julian Chief Complaint: Chest pain   History of Present Illness:   Brian Dickerson is a 60 y.o. male with hx of CAD s/p mid LAD DES, HLD, and HTN who presented for chest pain.   Hx of acute anterior MI in 2015 due to thrombotic occlusion of the mid LAD treated with drug-eluting stent with resultant 0% stenosis and TIMI grade 3 flow. He had widely patent circumflex and RCA. His LV dysfunction with apical and inferior apical severe hypokinesis EF 40%. Repeat echo in 3 months showed improved LV function to 55-60%.   Added to schedule for chest pain. Episode of sharp chest pain underneath L axilla Saturday lasting for few seconds. Intermittent since then. Yesterday he has sharp chest pain at left upper chest and underneath L breast with cough. He took SL nitro x 1 with resolution however pain reoccurred with in few minutes. Seems worsen with laying down. Has have some congestion symptoms same time. No fever or chills. Denies shortness of breath, palpitations, LE edema, orthopnea, pnd of syncope.   Has stopped taking Lipitor 3 months ago due to myalgia. Working with PCP --> plan to start Lovastatin vs nutritionist visit.    Past Medical History:  Diagnosis Date  . Acute MI   . HLD (hyperlipidemia)   . HTN (hypertension)     Past Surgical History:  Procedure Laterality Date  . LEFT HEART CATHETERIZATION WITH CORONARY ANGIOGRAM N/A 01/10/2014   Procedure: LEFT HEART CATHETERIZATION WITH CORONARY ANGIOGRAM;  Surgeon: Sinclair Grooms, MD;  Location: [facility] CATH LAB;  Service: Cardiovascular;  Laterality: N/A;  . none      Current Medications: Prior to Admission medications   Medication Sig Start Date End Date Taking? Authorizing Provider  aspirin 81 MG chewable tablet Chew 1 tablet (81 mg total) by mouth daily. 01/12/14   Brett Canales, PA-C   atorvastatin (LIPITOR) 10 MG tablet Take 1 tablet (10 mg total) by mouth daily. 10/09/15   Belva Crome, MD  Cholecalciferol (VITAMIN D) 2000 UNITS tablet Take 2,000 Units by mouth daily.    Historical Provider, MD  nitroGLYCERIN (NITROSTAT) 0.4 MG SL tablet Place 1 tablet (0.4 mg total) under the tongue every 5 (five) minutes as needed for chest pain. 01/12/14   Brett Canales, PA-C    Allergies:   Patient has no known allergies.   Social History   Social History  . Marital status: Married    Spouse name: N/A  . Number of children: N/A  . Years of education: N/A   Social History Main Topics  . Smoking status: Former Smoker    Types: Cigarettes  . Smokeless tobacco: Never Used  . Alcohol use 2.4 oz/week    4 Cans of beer per week  . Drug use: No  . Sexual activity: Not Asked   Other Topics Concern  . None   Social History Narrative  . None     Family History:  The patient's family history is not on file.   ROS:   Please see the history of present illness.    ROS All other systems reviewed and are negative.   PHYSICAL EXAM:   VS:  BP 138/82   Pulse 65   Ht 5\' 5"  (1.651 m)   Wt 189 lb (85.7 kg)   BMI 31.45  kg/m    GEN: Well nourished, well developed, in no acute distress  HEENT: normal  Neck: no JVD, carotid bruits, or masses Cardiac: RRR; no murmurs, rubs, or gallops,no edema. Chest wall tenderness with palpation Respiratory:  clear to auscultation bilaterally, normal work of breathing GI: soft, nontender, nondistended, + BS MS: no deformity or atrophy  Skin: warm and dry, no rash Neuro:  Alert and Oriented x 3, Strength and sensation are intact Psych: euthymic mood, full affect  Wt Readings from Last 3 Encounters:  04/12/16 189 lb (85.7 kg)  07/30/15 188 lb 3.2 oz (85.4 kg)  05/13/14 162 lb 12.8 oz (73.8 kg)      Studies/Labs Reviewed:   EKG:  EKG is ordered today.  The ekg ordered today demonstrates NSR at rate of 65 bpm.   Recent Labs: 10/08/2015: ALT  46   Lipid Panel    Component Value Date/Time   CHOL 149 10/08/2015 0803   TRIG 225 (H) 10/08/2015 0803   HDL 43 10/08/2015 0803   CHOLHDL 3.5 10/08/2015 0803   CHOLHDL 3 05/15/2014 0800   VLDL 30.0 05/15/2014 0800   LDLCALC 61 10/08/2015 0803    Additional studies/ records that were reviewed today include:   Echocardiogram: 05/2014 LV EF: 55% -  60%  ------------------------------------------------------------------- Indications:   CHF (I50.41).  ------------------------------------------------------------------- History:  PMH:  Coronary artery disease. Congestive heart failure. PMH:  Myocardial infarction. Risk factors: Former tobacco use. Dyslipidemia.  ------------------------------------------------------------------- Study Conclusions  - Left ventricle: The cavity size was normal. Systolic function was normal. The estimated ejection fraction was in the range of 55% to 60%. Wall motion was normal; there were no regional wall motion abnormalities. The tissue Doppler parameters were normal. Left ventricular diastolic function parameters were normal. - Aortic valve: Trileaflet; mildly thickened leaflets. There was no regurgitation. - Aortic root: The aortic root was normal in size. - Mitral valve: Structurally normal valve. There was trivial regurgitation. - Left atrium: The atrium was normal in size. - Right ventricle: Systolic function was normal. - Right atrium: The atrium was normal in size. - Tricuspid valve: There was trivial regurgitation. - Pulmonic valve: There was no regurgitation. - Pulmonary arteries: Systolic pressure was within the normal range. PA peak pressure: 34 mm Hg (S). - Inferior vena cava: The vessel was normal in size. - Pericardium, extracardiac: There was no pericardial effusion.  Impressions:  - Normal systolic and diastolic function. Normal filling pressures. Mild mitral and tricuspid  regurgitation.  Cardiac Catheterization: 01/10/2014 ANGIOGRAPHIC DATA:   The left main coronary artery is widely patent.  The left anterior descending artery is severely diseased with a segmental 90% stenosis starting after the first septal perforator becoming a total occlusion after the large second diagonal branch. The diagonals are widely patent despite high-grade disease in the mid LAD.  The left circumflex artery is widely patent and gives origin to 2 obtuse marginal branches which contain no significant obstruction..  The right coronary artery is dominant. Mid vessel 30% narrowing is noted. A large PDA and 2 left ventricular branch is also widely patent.  PCI RESULTS: The totally occluded segmental stenosis in the mid LAD was reduced to 0% with TIMI grade 3 flow after positioning and deploying a 3.0 x 38 mm long drug-eluting stent (Promus Premier) post dilated to 3.75 mm in diameter.  LEFT VENTRICULOGRAM:  Left ventricular angiogram was done in the 30 RAO projection and revealed apical and inferoapical severe hypokinesis. Ejection fraction 40%.   IMPRESSIONS:  1.  Acute coronary syndrome/ST elevation anterior myocardial infarction due to thrombotic occlusion of the mid LAD treated with drug-eluting stent with resultant 0% stenosis and TIMI grade 3 flow as outlined above. 2. Widely patent circumflex and right coronary 3. Left ventricular dysfunction with apical and inferoapical severe hypokinesis. LVEF 40% 4. Time to reperfusion was increased due to ambiguous EKG, that in retrospect was highly suggestive of ST elevation MI given the patient's complaints.   RECOMMENDATION:  Aspirin and Brilinta Beta blocker therapy ACE inhibitor therapy Statin therapy Potential candidate for early discharge on Sunday morning assuming no arrhythmias, heart failure, recurrent ischemia, or mechanical complications. Aggrastat bolus given at completion of case the thrombus burden.    ASSESSMENT &  PLAN:    1. Chest pain with hx of CAD s/p DES to mid LAD - Atypical. Pleuritic in nature. Worsen with cough and laying down. This been intermittent. Also have congestion. Chest pain reproducible palpation. Differential includes bronchitis / MSK / possible pericarditis. EKG reassuring. No associated dyspnea, nausea or vomiting. No radiation. This pain is different from prior MI pain. Hx of GERD. - Trial of NSAID and PPI. He will let us know if worsening symptoms of chest pain. F/u with PCP if worsening congestion.   2. HTN - Stable and well controlled. Not on any medications.   3. HLD - 10/08/2015: Cholesterol, Total 149; HDL Cholesterol 43; LDL, Calculated 61; Triglycerides 225. HAD Lipid panel afterwards (no result available) - Current off Lipitor due to myalgia. Followed by PCP. Plan to start lovastatin vs nutritionist f/u.   Medication Adjustments/Labs and Tests Ordered: Current medicines are reviewed at length with the patient today.  Concerns regarding medicines are outlined above.  Medication changes, Labs and Tests ordered today are listed in the Patient Instructions below. Patient Instructions  Medication Instructions:   TAKE IBUPROFEN 600 MG THREE TIMES DAILY WITH MEALS FOR 2 WEEKS ONLY  TAKE PROTONIX 40 MG BY MOUTH DAILY FOR 30 DAYS ONLY    Follow-Up:  2 MONTHS WITH DR Tamala Julian     If you need a refill on your cardiac medications before your next appointment, please call your pharmacy.      Jarrett Soho, [place]  04/12/2016 9:08 AM    [facility] [place], [place], [place]  [address] Phone: [phone]; Fax: [phone]

## 2016-05-13 DIAGNOSIS — E291 Testicular hypofunction: Secondary | ICD-10-CM | POA: Diagnosis not present

## 2016-05-18 DIAGNOSIS — L309 Dermatitis, unspecified: Secondary | ICD-10-CM | POA: Diagnosis not present

## 2016-05-18 DIAGNOSIS — E291 Testicular hypofunction: Secondary | ICD-10-CM | POA: Diagnosis not present

## 2016-06-17 ENCOUNTER — Other Ambulatory Visit: Payer: Self-pay | Admitting: Physician Assistant

## 2016-06-17 DIAGNOSIS — I2510 Atherosclerotic heart disease of native coronary artery without angina pectoris: Secondary | ICD-10-CM

## 2016-06-17 DIAGNOSIS — R0789 Other chest pain: Secondary | ICD-10-CM

## 2016-06-29 DIAGNOSIS — M5137 Other intervertebral disc degeneration, lumbosacral region: Secondary | ICD-10-CM | POA: Diagnosis not present

## 2016-06-29 DIAGNOSIS — M9903 Segmental and somatic dysfunction of lumbar region: Secondary | ICD-10-CM | POA: Diagnosis not present

## 2016-06-29 DIAGNOSIS — M9905 Segmental and somatic dysfunction of pelvic region: Secondary | ICD-10-CM | POA: Diagnosis not present

## 2016-06-30 DIAGNOSIS — M9905 Segmental and somatic dysfunction of pelvic region: Secondary | ICD-10-CM | POA: Diagnosis not present

## 2016-06-30 DIAGNOSIS — M5137 Other intervertebral disc degeneration, lumbosacral region: Secondary | ICD-10-CM | POA: Diagnosis not present

## 2016-06-30 DIAGNOSIS — M9903 Segmental and somatic dysfunction of lumbar region: Secondary | ICD-10-CM | POA: Diagnosis not present

## 2016-07-01 DIAGNOSIS — M9905 Segmental and somatic dysfunction of pelvic region: Secondary | ICD-10-CM | POA: Diagnosis not present

## 2016-07-01 DIAGNOSIS — M5137 Other intervertebral disc degeneration, lumbosacral region: Secondary | ICD-10-CM | POA: Diagnosis not present

## 2016-07-01 DIAGNOSIS — M9903 Segmental and somatic dysfunction of lumbar region: Secondary | ICD-10-CM | POA: Diagnosis not present

## 2016-07-04 DIAGNOSIS — Z86010 Personal history of colonic polyps: Secondary | ICD-10-CM | POA: Diagnosis not present

## 2016-07-08 DIAGNOSIS — E785 Hyperlipidemia, unspecified: Secondary | ICD-10-CM | POA: Diagnosis not present

## 2016-07-08 DIAGNOSIS — R739 Hyperglycemia, unspecified: Secondary | ICD-10-CM | POA: Diagnosis not present

## 2016-07-08 DIAGNOSIS — E291 Testicular hypofunction: Secondary | ICD-10-CM | POA: Diagnosis not present

## 2016-07-08 DIAGNOSIS — Z79899 Other long term (current) drug therapy: Secondary | ICD-10-CM | POA: Diagnosis not present

## 2016-07-13 DIAGNOSIS — M9903 Segmental and somatic dysfunction of lumbar region: Secondary | ICD-10-CM | POA: Diagnosis not present

## 2016-07-13 DIAGNOSIS — M9905 Segmental and somatic dysfunction of pelvic region: Secondary | ICD-10-CM | POA: Diagnosis not present

## 2016-07-13 DIAGNOSIS — M5137 Other intervertebral disc degeneration, lumbosacral region: Secondary | ICD-10-CM | POA: Diagnosis not present

## 2016-07-13 NOTE — Progress Notes (Signed)
Cardiology Office Note    Date:  07/14/2016   ID:  Brian Dickerson, DOB [birth year], MRN [id number]  PCP:  Cammy Copa, MD  Cardiologist: Sinclair Grooms, MD   Chief Complaint  Patient presents with  . Coronary Artery Disease    History of Present Illness:  Brian Dickerson is a 61 y.o. male with hx of CAD s/p mid LAD DES 2015, HLD, and HTN who presented for chest pain.   He was last seen here in the office by Robbie [facility] in December 2017. It turns out that the discomfort improved immensely with proton pump inhibitor therapy. He has no exertional discomfort. He denies palpitations. There is no dyspnea or exertional limitation. He is losing weight and watching his diet. He is down 15 pounds. He was started on statin therapy by Dr. Sheryn Bison.  Past Medical History:  Diagnosis Date  . Acute MI   . HLD (hyperlipidemia)   . HTN (hypertension)     Past Surgical History:  Procedure Laterality Date  . LEFT HEART CATHETERIZATION WITH CORONARY ANGIOGRAM N/A 01/10/2014   Procedure: LEFT HEART CATHETERIZATION WITH CORONARY ANGIOGRAM;  Surgeon: Sinclair Grooms, MD;  Location: [facility] CATH LAB;  Service: Cardiovascular;  Laterality: N/A;  . none      Current Medications: Outpatient Medications Prior to Visit  Medication Sig Dispense Refill  . aspirin 81 MG chewable tablet Chew 1 tablet (81 mg total) by mouth daily.    . Cholecalciferol (VITAMIN D) 2000 UNITS tablet Take 2,000 Units by mouth daily.    Marland Kitchen ibuprofen (ADVIL,MOTRIN) 600 MG tablet Take 1 tablet (600 mg total) by mouth 3 (three) times daily. TAKE FOR 2 WEEKS ONLY 30 tablet 0  . nitroGLYCERIN (NITROSTAT) 0.4 MG SL tablet Place 1 tablet (0.4 mg total) under the tongue every 5 (five) minutes as needed for chest pain. 25 tablet 12  . Omega-3 Fatty Acids (FISH OIL OMEGA-3 PO) Take 1 capsule by mouth daily.    . pantoprazole (PROTONIX) 40 MG tablet Take 1 tablet (40 mg total) by mouth daily. 30 tablet 0  . pantoprazole  (PROTONIX) 40 MG tablet TAKE 1 TABLET DAILY (Patient not taking: Reported on 07/14/2016) 90 tablet 1   No facility-administered medications prior to visit.      Allergies:   Patient has no known allergies.   Social History   Social History  . Marital status: Married    Spouse name: N/A  . Number of children: N/A  . Years of education: N/A   Social History Main Topics  . Smoking status: Former Smoker    Types: Cigarettes  . Smokeless tobacco: Never Used  . Alcohol use 2.4 oz/week    4 Cans of beer per week  . Drug use: No  . Sexual activity: Not Asked   Other Topics Concern  . None   Social History Narrative  . None     Family History:  The patient's family history is not on file.   ROS:   Please see the history of present illness.    Occasional cough and back discomfort. See in an acupuncture specialist for back discomfort.  All other systems reviewed and are negative.   PHYSICAL EXAM:   VS:  BP 116/76 (BP Location: Left Arm)   Pulse 71   Ht 5\' 5"  (1.651 m)   Wt 172 lb 9.6 oz (78.3 kg)   BMI 28.72 kg/m    GEN: Well nourished, well developed, in no acute distress  HEENT: normal  Neck: no JVD, carotid bruits, or masses Cardiac: RRR; no murmurs, rubs, or gallops,no edema  Respiratory:  clear to auscultation bilaterally, normal work of breathing GI: soft, nontender, nondistended, + BS MS: no deformity or atrophy  Skin: warm and dry, no rash Neuro:  Alert and Oriented x 3, Strength and sensation are intact Psych: euthymic mood, full affect  Wt Readings from Last 3 Encounters:  07/14/16 172 lb 9.6 oz (78.3 kg)  04/12/16 189 lb (85.7 kg)  07/30/15 188 lb 3.2 oz (85.4 kg)      Studies/Labs Reviewed:   EKG:  EKG  Performed 04/12/16 demonstrates a normal appearance.  Recent Labs: 10/08/2015: ALT 46   Lipid Panel    Component Value Date/Time   CHOL 149 10/08/2015 0803   TRIG 225 (H) 10/08/2015 0803   HDL 43 10/08/2015 0803   CHOLHDL 3.5 10/08/2015 0803    CHOLHDL 3 05/15/2014 0800   VLDL 30.0 05/15/2014 0800   LDLCALC 61 10/08/2015 0803    Additional studies/ records that were reviewed today include:  Echocardiogram 2016: Study Conclusions  - Left ventricle: The cavity size was normal. Systolic function was normal. The estimated ejection fraction was in the range of 55% to 60%. Wall motion was normal; there were no regional wall motion abnormalities. The tissue Doppler parameters were normal. Left ventricular diastolic function parameters were normal. - Aortic valve: Trileaflet; mildly thickened leaflets. There was no regurgitation. - Aortic root: The aortic root was normal in size. - Mitral valve: Structurally normal valve. There was trivial regurgitation. - Left atrium: The atrium was normal in size. - Right ventricle: Systolic function was normal. - Right atrium: The atrium was normal in size. - Tricuspid valve: There was trivial regurgitation. - Pulmonic valve: There was no regurgitation. - Pulmonary arteries: Systolic pressure was within the normal range. PA peak pressure: 34 mm Hg (S). - Inferior vena cava: The vessel was normal in size. - Pericardium, extracardiac: There was no pericardial effusion.  Impressions:  - Normal systolic and diastolic function. Normal filling pressures. Mild mitral and tricuspid regurgitation.  Coronary angiography and PCI results from 2015: IMPRESSIONS:1.Acute coronary syndrome/ST elevation anterior myocardial infarction due to thrombotic occlusion of the mid LAD treated with drug-eluting stent with resultant 0% stenosis and TIMI grade 3 flow as outlined above. 2. Widely patent circumflex and right coronary 3. Left ventricular dysfunction with apical and inferoapical severe hypokinesis. LVEF 40% 4. Time to reperfusion was increased due to ambiguous EKG, that in retrospect was highly suggestive of ST elevation MI given the patient's complaints.   ASSESSMENT:    1.  Coronary artery disease involving native coronary artery of native heart without angina pectoris   2. Other hyperlipidemia      PLAN:  In order of problems listed above:  1. Stable without symptoms, return of LV function back to normal despite anterior infarction in 2015. Recommended aerobic exercise, weight control, and other risk factor modification as needed in the future. Clinical follow-up in one year. 2. Lipids are being managed by Dr. Sheryn Bison, currently on rosuvastatin 10 mg 3 times per week. On that regimen the recent LDL was 46, total cholesterol 117, HDL 41.    Medication Adjustments/Labs and Tests Ordered: Current medicines are reviewed at length with the patient today.  Concerns regarding medicines are outlined above.  Medication changes, Labs and Tests ordered today are listed in the Patient Instructions below. Patient Instructions  Medication Instructions:  None  Labwork: None  Testing/Procedures: None  Follow-Up: Your physician wants you to follow-up in: 1 year with Dr. Tamala Julian. You will receive a reminder letter in the mail two months in advance. If you don't receive a letter, please call our office to schedule the follow-up appointment.   Any Other Special Instructions Will Be Listed Below (If Applicable).     If you need a refill on your cardiac medications before your next appointment, please call your pharmacy.      Signed, Sinclair Grooms, MD  07/14/2016 8:36 AM    [facility] [place], [place], [place]  [address] Phone: [phone]; Fax: [phone]

## 2016-07-14 ENCOUNTER — Encounter (INDEPENDENT_AMBULATORY_CARE_PROVIDER_SITE_OTHER): Payer: Self-pay

## 2016-07-14 ENCOUNTER — Ambulatory Visit (INDEPENDENT_AMBULATORY_CARE_PROVIDER_SITE_OTHER): Payer: 59 | Admitting: Interventional Cardiology

## 2016-07-14 ENCOUNTER — Encounter: Payer: Self-pay | Admitting: Interventional Cardiology

## 2016-07-14 VITALS — BP 116/76 | HR 71 | Ht 65.0 in | Wt 172.6 lb

## 2016-07-14 DIAGNOSIS — I2510 Atherosclerotic heart disease of native coronary artery without angina pectoris: Secondary | ICD-10-CM

## 2016-07-14 DIAGNOSIS — E784 Other hyperlipidemia: Secondary | ICD-10-CM | POA: Diagnosis not present

## 2016-07-14 DIAGNOSIS — E7849 Other hyperlipidemia: Secondary | ICD-10-CM

## 2016-07-14 NOTE — Patient Instructions (Signed)

## 2016-07-26 DIAGNOSIS — M9903 Segmental and somatic dysfunction of lumbar region: Secondary | ICD-10-CM | POA: Diagnosis not present

## 2016-07-26 DIAGNOSIS — M5137 Other intervertebral disc degeneration, lumbosacral region: Secondary | ICD-10-CM | POA: Diagnosis not present

## 2016-07-26 DIAGNOSIS — M9905 Segmental and somatic dysfunction of pelvic region: Secondary | ICD-10-CM | POA: Diagnosis not present

## 2016-07-28 ENCOUNTER — Telehealth: Payer: Self-pay | Admitting: Interventional Cardiology

## 2016-07-28 DIAGNOSIS — M9905 Segmental and somatic dysfunction of pelvic region: Secondary | ICD-10-CM | POA: Diagnosis not present

## 2016-07-28 DIAGNOSIS — M9903 Segmental and somatic dysfunction of lumbar region: Secondary | ICD-10-CM | POA: Diagnosis not present

## 2016-07-28 DIAGNOSIS — M5137 Other intervertebral disc degeneration, lumbosacral region: Secondary | ICD-10-CM | POA: Diagnosis not present

## 2016-07-28 MED ORDER — NITROGLYCERIN 0.4 MG SL SUBL: 0.4000 mg | SUBLINGUAL_TABLET | SUBLINGUAL | 11 refills | Status: DC | PRN

## 2016-07-28 NOTE — Telephone Encounter (Signed)
New message       *STAT* If patient is at the pharmacy, call can be transferred to refill team.   1. Which medications need to be refilled? (please list name of each medication and dose if known) nitro 2. Which pharmacy/location (including street and city if local pharmacy) is medication to be sent to? [facility] [address] 3. Do they need a 30 day or 90 day supply? [place]

## 2016-07-28 NOTE — Telephone Encounter (Signed)
Pt's Rx was sent to pt's pharmacy as requested. Confirmation received.  °

## 2016-08-01 DIAGNOSIS — M9905 Segmental and somatic dysfunction of pelvic region: Secondary | ICD-10-CM | POA: Diagnosis not present

## 2016-08-01 DIAGNOSIS — M5137 Other intervertebral disc degeneration, lumbosacral region: Secondary | ICD-10-CM | POA: Diagnosis not present

## 2016-08-01 DIAGNOSIS — M9903 Segmental and somatic dysfunction of lumbar region: Secondary | ICD-10-CM | POA: Diagnosis not present

## 2016-08-02 DIAGNOSIS — M9903 Segmental and somatic dysfunction of lumbar region: Secondary | ICD-10-CM | POA: Diagnosis not present

## 2016-08-02 DIAGNOSIS — M5137 Other intervertebral disc degeneration, lumbosacral region: Secondary | ICD-10-CM | POA: Diagnosis not present

## 2016-08-02 DIAGNOSIS — M9905 Segmental and somatic dysfunction of pelvic region: Secondary | ICD-10-CM | POA: Diagnosis not present

## 2016-09-30 DIAGNOSIS — K5289 Other specified noninfective gastroenteritis and colitis: Secondary | ICD-10-CM | POA: Diagnosis not present

## 2016-09-30 DIAGNOSIS — Z86010 Personal history of colonic polyps: Secondary | ICD-10-CM | POA: Diagnosis not present

## 2016-09-30 DIAGNOSIS — K529 Noninfective gastroenteritis and colitis, unspecified: Secondary | ICD-10-CM | POA: Diagnosis not present

## 2016-10-12 DIAGNOSIS — E785 Hyperlipidemia, unspecified: Secondary | ICD-10-CM | POA: Diagnosis not present

## 2016-10-12 DIAGNOSIS — Z79899 Other long term (current) drug therapy: Secondary | ICD-10-CM | POA: Diagnosis not present

## 2016-11-11 DIAGNOSIS — M545 Low back pain: Secondary | ICD-10-CM | POA: Diagnosis not present

## 2016-11-16 DIAGNOSIS — E782 Mixed hyperlipidemia: Secondary | ICD-10-CM | POA: Diagnosis not present

## 2016-11-16 DIAGNOSIS — E291 Testicular hypofunction: Secondary | ICD-10-CM | POA: Diagnosis not present

## 2016-11-16 DIAGNOSIS — M545 Low back pain: Secondary | ICD-10-CM | POA: Diagnosis not present

## 2016-11-30 DIAGNOSIS — Z79899 Other long term (current) drug therapy: Secondary | ICD-10-CM | POA: Diagnosis not present

## 2016-11-30 DIAGNOSIS — E291 Testicular hypofunction: Secondary | ICD-10-CM | POA: Diagnosis not present

## 2016-11-30 DIAGNOSIS — N400 Enlarged prostate without lower urinary tract symptoms: Secondary | ICD-10-CM | POA: Diagnosis not present

## 2016-12-05 ENCOUNTER — Other Ambulatory Visit: Payer: Self-pay | Admitting: Interventional Cardiology

## 2016-12-05 DIAGNOSIS — I2510 Atherosclerotic heart disease of native coronary artery without angina pectoris: Secondary | ICD-10-CM

## 2016-12-05 DIAGNOSIS — R0789 Other chest pain: Secondary | ICD-10-CM

## 2016-12-05 MED ORDER — PANTOPRAZOLE SODIUM 40 MG PO TBEC: 40.0000 mg | DELAYED_RELEASE_TABLET | Freq: Every day | ORAL | 2 refills | Status: DC

## 2016-12-05 NOTE — Telephone Encounter (Signed)
Pt's medication was sent to pt's pharmacy as requested. Confirmation received.  °

## 2017-02-01 DIAGNOSIS — Z0000 Encounter for general adult medical examination without abnormal findings: Secondary | ICD-10-CM | POA: Diagnosis not present

## 2017-02-01 DIAGNOSIS — E785 Hyperlipidemia, unspecified: Secondary | ICD-10-CM | POA: Diagnosis not present

## 2017-02-01 DIAGNOSIS — I2510 Atherosclerotic heart disease of native coronary artery without angina pectoris: Secondary | ICD-10-CM | POA: Diagnosis not present

## 2017-02-01 DIAGNOSIS — E291 Testicular hypofunction: Secondary | ICD-10-CM | POA: Diagnosis not present

## 2017-02-16 DIAGNOSIS — R972 Elevated prostate specific antigen [PSA]: Secondary | ICD-10-CM | POA: Diagnosis not present

## 2017-02-16 DIAGNOSIS — E291 Testicular hypofunction: Secondary | ICD-10-CM | POA: Diagnosis not present

## 2017-02-16 DIAGNOSIS — N400 Enlarged prostate without lower urinary tract symptoms: Secondary | ICD-10-CM | POA: Diagnosis not present

## 2017-03-24 DIAGNOSIS — R972 Elevated prostate specific antigen [PSA]: Secondary | ICD-10-CM | POA: Diagnosis not present

## 2017-03-24 DIAGNOSIS — N401 Enlarged prostate with lower urinary tract symptoms: Secondary | ICD-10-CM | POA: Diagnosis not present

## 2017-05-08 DIAGNOSIS — R972 Elevated prostate specific antigen [PSA]: Secondary | ICD-10-CM | POA: Diagnosis not present

## 2017-05-18 DIAGNOSIS — R972 Elevated prostate specific antigen [PSA]: Secondary | ICD-10-CM | POA: Diagnosis not present

## 2017-05-18 DIAGNOSIS — N401 Enlarged prostate with lower urinary tract symptoms: Secondary | ICD-10-CM | POA: Diagnosis not present

## 2017-07-06 DIAGNOSIS — J3489 Other specified disorders of nose and nasal sinuses: Secondary | ICD-10-CM | POA: Diagnosis not present

## 2017-07-06 DIAGNOSIS — H9201 Otalgia, right ear: Secondary | ICD-10-CM | POA: Diagnosis not present

## 2017-07-06 DIAGNOSIS — M26621 Arthralgia of right temporomandibular joint: Secondary | ICD-10-CM | POA: Diagnosis not present

## 2017-07-25 DIAGNOSIS — M545 Low back pain: Secondary | ICD-10-CM | POA: Diagnosis not present

## 2017-08-01 ENCOUNTER — Other Ambulatory Visit: Payer: Self-pay | Admitting: Interventional Cardiology

## 2017-08-02 DIAGNOSIS — M545 Low back pain: Secondary | ICD-10-CM | POA: Diagnosis not present

## 2017-08-09 DIAGNOSIS — M47816 Spondylosis without myelopathy or radiculopathy, lumbar region: Secondary | ICD-10-CM | POA: Diagnosis not present

## 2017-08-17 DIAGNOSIS — E291 Testicular hypofunction: Secondary | ICD-10-CM | POA: Diagnosis not present

## 2017-08-17 DIAGNOSIS — N401 Enlarged prostate with lower urinary tract symptoms: Secondary | ICD-10-CM | POA: Diagnosis not present

## 2017-08-29 ENCOUNTER — Encounter: Payer: Self-pay | Admitting: Interventional Cardiology

## 2017-09-05 DIAGNOSIS — M47816 Spondylosis without myelopathy or radiculopathy, lumbar region: Secondary | ICD-10-CM | POA: Diagnosis not present

## 2017-09-07 ENCOUNTER — Encounter: Payer: Self-pay | Admitting: Interventional Cardiology

## 2017-09-07 ENCOUNTER — Ambulatory Visit: Payer: 59 | Admitting: Interventional Cardiology

## 2017-09-07 VITALS — BP 124/86 | HR 61 | Ht 65.0 in | Wt 162.0 lb

## 2017-09-07 DIAGNOSIS — E785 Hyperlipidemia, unspecified: Secondary | ICD-10-CM

## 2017-09-07 DIAGNOSIS — I2109 ST elevation (STEMI) myocardial infarction involving other coronary artery of anterior wall: Secondary | ICD-10-CM

## 2017-09-07 DIAGNOSIS — I2510 Atherosclerotic heart disease of native coronary artery without angina pectoris: Secondary | ICD-10-CM

## 2017-09-07 NOTE — Patient Instructions (Signed)

## 2017-09-07 NOTE — Progress Notes (Signed)
Cardiology Office Note    Date:  09/07/2017   ID:  Brian Dickerson, DOB [birth year], MRN [id number]  PCP:  Aura Dials, MD  Cardiologist: Sinclair Grooms, MD   Chief Complaint  Patient presents with  . Coronary Artery Disease    History of Present Illness:  Brian Dickerson is a 62 y.o. male with hx of CAD s/p mid LAD DES 2015, HLD, and HTN who presented for chest pain.    Brian Dickerson is doing well.  He has not had chest pain, orthopnea, PND.  He has no restrictions in physical activity.  He plays drums at least 1 hour 2-3 times per week.  He denies nitroglycerin use.  His quality of life is good.  His diet has changed with elimination of simple carbohydrates.  He has not had angina.  He has not had dyspnea.  There is no peripheral edema.  He feels well.   Past Medical History:  Diagnosis Date  . Acute MI ([place])   . HLD (hyperlipidemia)   . HTN (hypertension)     Past Surgical History:  Procedure Laterality Date  . LEFT HEART CATHETERIZATION WITH CORONARY ANGIOGRAM N/A 01/10/2014   Procedure: LEFT HEART CATHETERIZATION WITH CORONARY ANGIOGRAM;  Surgeon: Sinclair Grooms, MD;  Location: [facility] CATH LAB;  Service: Cardiovascular;  Laterality: N/A;  . none      Current Medications: Outpatient Medications Prior to Visit  Medication Sig Dispense Refill  . aspirin 81 MG chewable tablet Chew 1 tablet (81 mg total) by mouth daily.    . Cholecalciferol (VITAMIN D) 2000 UNITS tablet Take 2,000 Units by mouth daily.    Marland Kitchen ibuprofen (ADVIL,MOTRIN) 600 MG tablet Take 1 tablet (600 mg total) by mouth 3 (three) times daily. TAKE FOR 2 WEEKS ONLY 30 tablet 0  . nitroGLYCERIN (NITROSTAT) 0.4 MG SL tablet PLACE 1 TABLET UNDER THE TONGUE EVERY 5 MINUTES AS NEEDED FOR CHEST PAIN 25 tablet 0  . Omega-3 Fatty Acids (FISH OIL OMEGA-3 PO) Take 1 capsule by mouth daily.    . pantoprazole (PROTONIX) 40 MG tablet Take 1 tablet (40 mg total) by mouth daily. 90 tablet 2  . rosuvastatin (CRESTOR) 10 MG  tablet Take 10 mg by mouth every Monday, Wednesday, and Friday.     No facility-administered medications prior to visit.      Allergies:   Patient has no known allergies.   Social History   Socioeconomic History  . Marital status: Married    Spouse name: Not on file  . Number of children: Not on file  . Years of education: Not on file  . Highest education level: Not on file  Occupational History  . Not on file  Social Needs  . Financial resource strain: Not on file  . Food insecurity:    Worry: Not on file    Inability: Not on file  . Transportation needs:    Medical: Not on file    Non-medical: Not on file  Tobacco Use  . Smoking status: Former Smoker    Types: Cigarettes  . Smokeless tobacco: Never Used  Substance and Sexual Activity  . Alcohol use: Yes    Alcohol/week: 2.4 oz    Types: 4 Cans of beer per week  . Drug use: No  . Sexual activity: Not on file  Lifestyle  . Physical activity:    Days per week: Not on file    Minutes per session: Not on file  . Stress: Not on file  Relationships  . Social connections:    Talks on phone: Not on file    Gets together: Not on file    Attends religious service: Not on file    Active member of club or organization: Not on file    Attends meetings of clubs or organizations: Not on file    Relationship status: Not on file  Other Topics Concern  . Not on file  Social History Narrative  . Not on file     Family History:  The patient's family history is not on file.   ROS:   Please see the history of present illness.    None All other systems reviewed and are negative.   PHYSICAL EXAM:   VS:  BP 124/86   Pulse 61   Ht 5\' 5"  (1.651 m)   Wt 162 lb (73.5 kg)   BMI 26.96 kg/m    GEN: Well nourished, well developed, in no acute distress  HEENT: normal  Neck: no JVD, carotid bruits, or masses Cardiac: RRR; no murmurs, rubs, or gallops,no edema  Respiratory:  clear to auscultation bilaterally, normal work of  breathing GI: soft, nontender, nondistended, + BS MS: no deformity or atrophy  Skin: warm and dry, no rash Neuro:  Alert and Oriented x 3, Strength and sensation are intact Psych: euthymic mood, full affect  Wt Readings from Last 3 Encounters:  09/07/17 162 lb (73.5 kg)  07/14/16 172 lb 9.6 oz (78.3 kg)  04/12/16 189 lb (85.7 kg)      Studies/Labs Reviewed:   EKG:  EKG normal sinus rhythm/sinus bradycardia, short PR interval, otherwise unremarkable.  Recent Labs: No results found for requested labs within last 8760 hours.   Lipid Panel    Component Value Date/Time   CHOL 149 10/08/2015 0803   TRIG 225 (H) 10/08/2015 0803   HDL 43 10/08/2015 0803   CHOLHDL 3.5 10/08/2015 0803   CHOLHDL 3 05/15/2014 0800   VLDL 30.0 05/15/2014 0800   LDLCALC 61 10/08/2015 0803    Additional studies/ records that were reviewed today include:  None    ASSESSMENT:    1. Coronary artery disease involving native coronary artery of native heart without angina pectoris   2. Acute anterior wall MI ([place])   3. Hyperlipidemia LDL goal <70      PLAN:  In order of problems listed above:  1. Stable without obvious anginal complaints.  No change in medical regimen is indicated.  No functional testing required.  Encouraged an active lifestyle.  Secondary risk modification including LDL less than 70. 2. No evidence of volume overload or CHF. 3. LDL less than 70 and to continue rosuvastatin 10 mg/day.  Clinical follow-up in 1 year.  Call if angina.  Medication Adjustments/Labs and Tests Ordered: Current medicines are reviewed at length with the patient today.  Concerns regarding medicines are outlined above.  Medication changes, Labs and Tests ordered today are listed in the Patient Instructions below. Patient Instructions  Medication Instructions:  Your physician recommends that you continue on your current medications as directed. Please refer to the Current Medication list given to you  today.   Labwork: None   Testing/Procedures: None   Follow-Up: Your physician wants you to follow-up in 1 year with Dr. Tamala Julian. You will receive a reminder letter in the mail two months in advance. If you don't receive a letter, please call our office to schedule the follow-up appointment.   Any Other Special Instructions Will Be Listed Below (If Applicable).  If you need a refill on your cardiac medications before your next appointment, please call your pharmacy.      Signed, Sinclair Grooms, MD  09/07/2017 5:41 PM    [facility] [place], [place], [place]  [address] Phone: [phone]; Fax: [phone]

## 2017-09-09 ENCOUNTER — Other Ambulatory Visit: Payer: Self-pay | Admitting: Interventional Cardiology

## 2017-09-09 DIAGNOSIS — I2510 Atherosclerotic heart disease of native coronary artery without angina pectoris: Secondary | ICD-10-CM

## 2017-09-09 DIAGNOSIS — R0789 Other chest pain: Secondary | ICD-10-CM

## 2017-10-05 DIAGNOSIS — Z23 Encounter for immunization: Secondary | ICD-10-CM | POA: Diagnosis not present

## 2017-10-05 DIAGNOSIS — E782 Mixed hyperlipidemia: Secondary | ICD-10-CM | POA: Diagnosis not present

## 2017-10-05 DIAGNOSIS — E291 Testicular hypofunction: Secondary | ICD-10-CM | POA: Diagnosis not present

## 2017-10-05 DIAGNOSIS — R030 Elevated blood-pressure reading, without diagnosis of hypertension: Secondary | ICD-10-CM | POA: Diagnosis not present

## 2017-10-05 DIAGNOSIS — N400 Enlarged prostate without lower urinary tract symptoms: Secondary | ICD-10-CM | POA: Diagnosis not present

## 2017-10-31 DIAGNOSIS — L821 Other seborrheic keratosis: Secondary | ICD-10-CM | POA: Diagnosis not present

## 2017-10-31 DIAGNOSIS — L720 Epidermal cyst: Secondary | ICD-10-CM | POA: Diagnosis not present

## 2017-11-17 DIAGNOSIS — E291 Testicular hypofunction: Secondary | ICD-10-CM | POA: Diagnosis not present

## 2017-11-21 DIAGNOSIS — M5032 Other cervical disc degeneration, mid-cervical region, unspecified level: Secondary | ICD-10-CM | POA: Diagnosis not present

## 2017-11-21 DIAGNOSIS — M9901 Segmental and somatic dysfunction of cervical region: Secondary | ICD-10-CM | POA: Diagnosis not present

## 2017-11-21 DIAGNOSIS — M9902 Segmental and somatic dysfunction of thoracic region: Secondary | ICD-10-CM | POA: Diagnosis not present

## 2017-11-22 DIAGNOSIS — M5032 Other cervical disc degeneration, mid-cervical region, unspecified level: Secondary | ICD-10-CM | POA: Diagnosis not present

## 2017-11-22 DIAGNOSIS — M9902 Segmental and somatic dysfunction of thoracic region: Secondary | ICD-10-CM | POA: Diagnosis not present

## 2017-11-22 DIAGNOSIS — M9901 Segmental and somatic dysfunction of cervical region: Secondary | ICD-10-CM | POA: Diagnosis not present

## 2017-11-24 DIAGNOSIS — M5032 Other cervical disc degeneration, mid-cervical region, unspecified level: Secondary | ICD-10-CM | POA: Diagnosis not present

## 2017-11-24 DIAGNOSIS — M9902 Segmental and somatic dysfunction of thoracic region: Secondary | ICD-10-CM | POA: Diagnosis not present

## 2017-11-24 DIAGNOSIS — R972 Elevated prostate specific antigen [PSA]: Secondary | ICD-10-CM | POA: Diagnosis not present

## 2017-11-24 DIAGNOSIS — M9901 Segmental and somatic dysfunction of cervical region: Secondary | ICD-10-CM | POA: Diagnosis not present

## 2017-11-27 DIAGNOSIS — M5032 Other cervical disc degeneration, mid-cervical region, unspecified level: Secondary | ICD-10-CM | POA: Diagnosis not present

## 2017-11-27 DIAGNOSIS — M9901 Segmental and somatic dysfunction of cervical region: Secondary | ICD-10-CM | POA: Diagnosis not present

## 2017-11-27 DIAGNOSIS — M9902 Segmental and somatic dysfunction of thoracic region: Secondary | ICD-10-CM | POA: Diagnosis not present

## 2017-11-30 DIAGNOSIS — M9902 Segmental and somatic dysfunction of thoracic region: Secondary | ICD-10-CM | POA: Diagnosis not present

## 2017-11-30 DIAGNOSIS — M5032 Other cervical disc degeneration, mid-cervical region, unspecified level: Secondary | ICD-10-CM | POA: Diagnosis not present

## 2017-11-30 DIAGNOSIS — M9901 Segmental and somatic dysfunction of cervical region: Secondary | ICD-10-CM | POA: Diagnosis not present

## 2017-12-05 DIAGNOSIS — M5032 Other cervical disc degeneration, mid-cervical region, unspecified level: Secondary | ICD-10-CM | POA: Diagnosis not present

## 2017-12-05 DIAGNOSIS — M9902 Segmental and somatic dysfunction of thoracic region: Secondary | ICD-10-CM | POA: Diagnosis not present

## 2017-12-05 DIAGNOSIS — M9901 Segmental and somatic dysfunction of cervical region: Secondary | ICD-10-CM | POA: Diagnosis not present

## 2017-12-07 DIAGNOSIS — M9902 Segmental and somatic dysfunction of thoracic region: Secondary | ICD-10-CM | POA: Diagnosis not present

## 2017-12-07 DIAGNOSIS — M9901 Segmental and somatic dysfunction of cervical region: Secondary | ICD-10-CM | POA: Diagnosis not present

## 2017-12-07 DIAGNOSIS — M5032 Other cervical disc degeneration, mid-cervical region, unspecified level: Secondary | ICD-10-CM | POA: Diagnosis not present

## 2017-12-11 DIAGNOSIS — L720 Epidermal cyst: Secondary | ICD-10-CM | POA: Diagnosis not present

## 2017-12-13 DIAGNOSIS — M9901 Segmental and somatic dysfunction of cervical region: Secondary | ICD-10-CM | POA: Diagnosis not present

## 2017-12-13 DIAGNOSIS — M9902 Segmental and somatic dysfunction of thoracic region: Secondary | ICD-10-CM | POA: Diagnosis not present

## 2017-12-13 DIAGNOSIS — M5032 Other cervical disc degeneration, mid-cervical region, unspecified level: Secondary | ICD-10-CM | POA: Diagnosis not present

## 2017-12-27 DIAGNOSIS — M542 Cervicalgia: Secondary | ICD-10-CM | POA: Diagnosis not present

## 2018-01-12 DIAGNOSIS — M542 Cervicalgia: Secondary | ICD-10-CM | POA: Diagnosis not present

## 2018-01-23 DIAGNOSIS — M542 Cervicalgia: Secondary | ICD-10-CM | POA: Diagnosis not present

## 2018-02-13 DIAGNOSIS — M542 Cervicalgia: Secondary | ICD-10-CM | POA: Diagnosis not present

## 2018-02-19 DIAGNOSIS — M542 Cervicalgia: Secondary | ICD-10-CM | POA: Diagnosis not present

## 2018-02-28 DIAGNOSIS — M542 Cervicalgia: Secondary | ICD-10-CM | POA: Diagnosis not present

## 2018-03-12 DIAGNOSIS — M7542 Impingement syndrome of left shoulder: Secondary | ICD-10-CM | POA: Diagnosis not present

## 2018-03-12 DIAGNOSIS — M542 Cervicalgia: Secondary | ICD-10-CM | POA: Diagnosis not present

## 2018-03-12 DIAGNOSIS — M25512 Pain in left shoulder: Secondary | ICD-10-CM | POA: Diagnosis not present

## 2018-05-24 DIAGNOSIS — R972 Elevated prostate specific antigen [PSA]: Secondary | ICD-10-CM | POA: Diagnosis not present

## 2018-05-24 DIAGNOSIS — R351 Nocturia: Secondary | ICD-10-CM | POA: Diagnosis not present

## 2018-05-24 DIAGNOSIS — E291 Testicular hypofunction: Secondary | ICD-10-CM | POA: Diagnosis not present

## 2018-05-24 DIAGNOSIS — N401 Enlarged prostate with lower urinary tract symptoms: Secondary | ICD-10-CM | POA: Diagnosis not present

## 2018-05-25 DIAGNOSIS — M542 Cervicalgia: Secondary | ICD-10-CM | POA: Diagnosis not present

## 2018-06-14 DIAGNOSIS — M542 Cervicalgia: Secondary | ICD-10-CM | POA: Diagnosis not present

## 2018-06-23 DIAGNOSIS — Z0100 Encounter for examination of eyes and vision without abnormal findings: Secondary | ICD-10-CM | POA: Diagnosis not present

## 2018-07-10 DIAGNOSIS — M5030 Other cervical disc degeneration, unspecified cervical region: Secondary | ICD-10-CM | POA: Diagnosis not present

## 2018-07-24 DIAGNOSIS — B029 Zoster without complications: Secondary | ICD-10-CM | POA: Diagnosis not present

## 2018-07-24 DIAGNOSIS — D225 Melanocytic nevi of trunk: Secondary | ICD-10-CM | POA: Diagnosis not present

## 2018-07-24 DIAGNOSIS — L821 Other seborrheic keratosis: Secondary | ICD-10-CM | POA: Diagnosis not present

## 2018-07-24 DIAGNOSIS — L812 Freckles: Secondary | ICD-10-CM | POA: Diagnosis not present

## 2018-07-24 DIAGNOSIS — D485 Neoplasm of uncertain behavior of skin: Secondary | ICD-10-CM | POA: Diagnosis not present

## 2018-07-24 DIAGNOSIS — L570 Actinic keratosis: Secondary | ICD-10-CM | POA: Diagnosis not present

## 2018-07-31 DIAGNOSIS — M5136 Other intervertebral disc degeneration, lumbar region: Secondary | ICD-10-CM | POA: Diagnosis not present

## 2018-08-16 DIAGNOSIS — M5031 Other cervical disc degeneration,  high cervical region: Secondary | ICD-10-CM | POA: Diagnosis not present

## 2018-08-23 DIAGNOSIS — Z79899 Other long term (current) drug therapy: Secondary | ICD-10-CM | POA: Diagnosis not present

## 2018-08-23 DIAGNOSIS — B351 Tinea unguium: Secondary | ICD-10-CM | POA: Diagnosis not present

## 2018-08-24 ENCOUNTER — Other Ambulatory Visit: Payer: Self-pay | Admitting: Interventional Cardiology

## 2018-08-24 DIAGNOSIS — R0789 Other chest pain: Secondary | ICD-10-CM

## 2018-08-24 DIAGNOSIS — I2510 Atherosclerotic heart disease of native coronary artery without angina pectoris: Secondary | ICD-10-CM

## 2018-08-30 ENCOUNTER — Telehealth: Payer: Self-pay | Admitting: Interventional Cardiology

## 2018-08-30 NOTE — Telephone Encounter (Signed)
Patient set up for MyChart?  Not signed up - sent link he will sign up before appt  Is patient using Smartphone/computer/tablet?smartphone  Did audio/video work?  Does patient need telephone visit?no  Best phone number to [facility]?[phone]  Special Instructions? Patient will have vitals, wt, and medication ready- patient gave verbal consent     Virtual Visit Pre-Appointment Phone Call  "(Name), I am calling you today to discuss your upcoming appointment. We are currently trying to limit exposure to the virus that causes COVID-19 by seeing patients at home rather than in the office."  1. "What is the BEST phone number to call the day of the visit?" - include this in appointment notes  2. Do you have or have access to (through a family member/friend) a smartphone with video capability that we can use for your visit?" a. If yes - list this number in appt notes as cell (if different from BEST phone #) and list the appointment type as a VIDEO visit in appointment notes b. If no - list the appointment type as a PHONE visit in appointment notes  3. Confirm consent - "In the setting of the current Covid19 crisis, you are scheduled for a (phone or video) visit with your provider on (date) at (time).  Just as we do with many in-office visits, in order for you to participate in this visit, we must obtain consent.  If you'd like, I can send this to your mychart (if signed up) or email for you to review.  Otherwise, I can obtain your verbal consent now.  All virtual visits are billed to your insurance company just like a normal visit would be.  By agreeing to a virtual visit, we'd like you to understand that the technology does not allow for your provider to perform an examination, and thus may limit your provider's ability to fully assess your condition. If your provider identifies any concerns that need to be evaluated in person, we will make arrangements to do so.  Finally, though the technology is  pretty good, we cannot assure that it will always work on either your or our end, and in the setting of a video visit, we may have to convert it to a phone-only visit.  In either situation, we cannot ensure that we have a secure connection.  Are you willing to proceed?" STAFF: Did the patient verbally acknowledge consent to telehealth visit? Document YES/NO here: yes  4. Advise patient to be prepared - "Two hours prior to your appointment, go ahead and check your blood pressure, pulse, oxygen saturation, and your weight (if you have the equipment to check those) and write them all down. When your visit starts, your provider will ask you for this information. If you have an Apple Watch or Kardia device, please plan to have heart rate information ready on the day of your appointment. Please have a pen and paper handy nearby the day of the visit as well."  5. Give patient instructions for MyChart download to smartphone OR [email or web address] as below if video visit (depending on what platform provider is using)  6. Inform patient they will receive a phone call 15 minutes prior to their appointment time (may be from unknown caller ID) so they should be prepared to answer    TELEPHONE CALL NOTE  Brian Dickerson has been deemed a candidate for a follow-up tele-health visit to limit community exposure during the Covid-19 pandemic. I spoke with the patient via phone to ensure availability of phone/video  source, confirm preferred email & phone number, and discuss instructions and expectations.  I reminded Brian Dickerson to be prepared with any vital sign and/or heart rhythm information that could potentially be obtained via home monitoring, at the time of his visit. I reminded Brian Dickerson to expect a phone call prior to his visit.  Brian Dickerson 08/30/2018 3:35 PM   INSTRUCTIONS FOR DOWNLOADING THE MYCHART APP TO SMARTPHONE  - The patient must first make sure to have activated MyChart and know their  login information - If Apple, go to [employer] and type in MyChart in the search bar and download the app. If Android, ask patient to go to [employer] and type in [facility] in the search bar and download the app. The app is free but as with any other app downloads, their phone may require them to verify saved payment information or Apple/Android password.  - The patient will need to then log into the app with their MyChart username and password, and select [facility] as their healthcare provider to link the account. When it is time for your visit, go to the MyChart app, find appointments, and click Begin Video Visit. Be sure to Select Allow for your device to access the Microphone and Camera for your visit. You will then be connected, and your provider will be with you shortly.  **If they have any issues connecting, or need assistance please contact MyChart service desk [phone])**  **If using a computer, in order to ensure the best quality for their visit they will need to use either of the following Internet Browsers: [employer], or Google Chrome**  IF USING DOXIMITY or DOXY.ME - The patient will receive a link just prior to their visit by text.     FULL LENGTH CONSENT FOR TELE-HEALTH VISIT   I hereby voluntarily request, consent and authorize [facility] and its employed or contracted physicians, physician assistants, nurse practitioners or other licensed health care professionals (the Practitioner), to provide me with telemedicine health care services (the Services") as deemed necessary by the treating Practitioner. I acknowledge and consent to receive the Services by the Practitioner via telemedicine. I understand that the telemedicine visit will involve communicating with the Practitioner through live audiovisual communication technology and the disclosure of certain medical information by electronic transmission. I acknowledge that I have been given the  opportunity to request an in-person assessment or other available alternative prior to the telemedicine visit and am voluntarily participating in the telemedicine visit.  I understand that I have the right to withhold or withdraw my consent to the use of telemedicine in the course of my care at any time, without affecting my right to future care or treatment, and that the Practitioner or I may terminate the telemedicine visit at any time. I understand that I have the right to inspect all information obtained and/or recorded in the course of the telemedicine visit and may receive copies of available information for a reasonable fee.  I understand that some of the potential risks of receiving the Services via telemedicine include:   Delay or interruption in medical evaluation due to technological equipment failure or disruption;  Information transmitted may not be sufficient (e.g. poor resolution of images) to allow for appropriate medical decision making by the Practitioner; and/or   In rare instances, security protocols could fail, causing a breach of personal health information.  Furthermore, I acknowledge that it is my responsibility to provide information about my medical history, conditions  and care that is complete and accurate to the best of my ability. I acknowledge that Practitioner's advice, recommendations, and/or decision may be based on factors not within their control, such as incomplete or inaccurate data provided by me or distortions of diagnostic images or specimens that may result from electronic transmissions. I understand that the practice of medicine is not an exact science and that Practitioner makes no warranties or guarantees regarding treatment outcomes. I acknowledge that I will receive a copy of this consent concurrently upon execution via email to the email address I last provided but may also request a printed copy by calling the office of [facility].    I understand that  my insurance will be billed for this visit.   I have read or had this consent read to me.  I understand the contents of this consent, which adequately explains the benefits and risks of the Services being provided via telemedicine.   I have been provided ample opportunity to ask questions regarding this consent and the Services and have had my questions answered to my satisfaction.  I give my informed consent for the services to be provided through the use of telemedicine in my medical care  By participating in this telemedicine visit I agree to the above.

## 2018-09-04 DIAGNOSIS — M5412 Radiculopathy, cervical region: Secondary | ICD-10-CM | POA: Diagnosis not present

## 2018-09-04 DIAGNOSIS — M5030 Other cervical disc degeneration, unspecified cervical region: Secondary | ICD-10-CM | POA: Diagnosis not present

## 2018-09-04 DIAGNOSIS — G8929 Other chronic pain: Secondary | ICD-10-CM | POA: Diagnosis not present

## 2018-09-05 ENCOUNTER — Other Ambulatory Visit: Payer: Self-pay | Admitting: Allergy and Immunology

## 2018-09-05 ENCOUNTER — Ambulatory Visit
Admission: RE | Admit: 2018-09-05 | Discharge: 2018-09-05 | Disposition: A | Discharge status: Home / Self Care | Payer: 59 | Source: Ambulatory Visit | Attending: Allergy and Immunology | Admitting: Allergy and Immunology

## 2018-09-05 ENCOUNTER — Other Ambulatory Visit: Payer: Self-pay

## 2018-09-05 DIAGNOSIS — R05 Cough: Secondary | ICD-10-CM

## 2018-09-05 DIAGNOSIS — R059 Cough, unspecified: Secondary | ICD-10-CM

## 2018-09-05 DIAGNOSIS — J301 Allergic rhinitis due to pollen: Secondary | ICD-10-CM | POA: Diagnosis not present

## 2018-09-05 DIAGNOSIS — J3089 Other allergic rhinitis: Secondary | ICD-10-CM | POA: Diagnosis not present

## 2018-09-06 NOTE — Progress Notes (Signed)
Virtual Visit via Video Note   This visit type was conducted due to national recommendations for restrictions regarding the COVID-19 Pandemic (e.g. social distancing) in an effort to limit this patient's exposure and mitigate transmission in our community.  Due to his co-morbid illnesses, this patient is at least at moderate risk for complications without adequate follow up.  This format is felt to be most appropriate for this patient at this time.  All issues noted in this document were discussed and addressed.  A limited physical exam was performed with this format.  Please refer to the patient's chart for his consent to telehealth for [facility].   Evaluation Performed:  Follow-up visit  Date:  09/07/2018   ID:  Brian Dickerson, DOB 1955/07/22, MRN [id number]  Patient Location: Home Provider Location: Office  PCP:  Aura Dials, MD  Cardiologist:  No primary care provider on file.  Electrophysiologist:  None   Chief Complaint:  CAD  History of Present Illness:    Brian Dickerson is a 63 y.o. male with CAD s/p mid LAD DES2015, HLD, and HTN who presented for chest pain.   Primary physician is Dr. Marisue Humble.  He has no cardiac complaints.  Over the past 4-week shutdown he has not been walking.  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  He denies chest pain, orthopnea, PND, and medication side effects.  He takes omega-3 fish oil.  We discussed that this is probably not doing much but if on his next lipid panel in the triglyceride is elevated, we should consider VASCEPA.  He has had a recent cough.  He had a chest x-ray performed yesterday that was clear.  He denies fever.  The patient does not have symptoms concerning for COVID-19 infection (fever, chills, cough, or new shortness of breath).    Past Medical History:  Diagnosis Date  . Acute MI ([place])   . HLD (hyperlipidemia)   . HTN (hypertension)    Past Surgical History:  Procedure Laterality  Date  . LEFT HEART CATHETERIZATION WITH CORONARY ANGIOGRAM N/A 01/10/2014   Procedure: LEFT HEART CATHETERIZATION WITH CORONARY ANGIOGRAM;  Surgeon: Sinclair Grooms, MD;  Location: [facility] CATH LAB;  Service: Cardiovascular;  Laterality: N/A;  . none       Current Meds  Medication Sig  . alfuzosin (UROXATRAL) 10 MG 24 hr tablet Take 10 mg by mouth 2 (two) times a day.  Marland Kitchen aspirin 81 MG chewable tablet Chew 1 tablet (81 mg total) by mouth daily.  . Cholecalciferol (VITAMIN D) 2000 UNITS tablet Take 2,000 Units by mouth daily.  . magnesium oxide (MAG-OX) 400 MG tablet Take 400 mg by mouth daily.  . meloxicam (MOBIC) 15 MG tablet Take 15 mg by mouth as needed.  . methocarbamol (ROBAXIN) 500 MG tablet Take 50 mg by mouth as needed.  . nitroGLYCERIN (NITROSTAT) 0.4 MG SL tablet PLACE 1 TABLET UNDER THE TONGUE EVERY 5 MINUTES AS NEEDED FOR CHEST PAIN  . Omega-3 Fatty Acids (FISH OIL OMEGA-3 PO) Take 1 capsule by mouth daily.  . pantoprazole (PROTONIX) 40 MG tablet Take 1 tablet (40 mg total) by mouth daily. Please keep upcoming appt with Dr. Tamala Julian in May for future refills. Thank you  . Potassium Gluconate 550 MG TABS Take 1 tablet by mouth daily.  . rosuvastatin (CRESTOR) 10 MG tablet Take 10 mg by mouth every Monday, Wednesday, and Friday.  . terbinafine (LAMISIL) 250 MG tablet Take  250 mg by mouth daily.  Marland Kitchen testosterone cypionate (DEPOTESTOTERONE CYPIONATE) 100 MG/ML injection Inject 100 mg into the muscle every 14 (fourteen) days. For IM use only  . valACYclovir (VALTREX) 1000 MG tablet Take 1 tablet by mouth as needed.     Allergies:   Patient has no known allergies.   Social History   Tobacco Use  . Smoking status: Former Smoker    Types: Cigarettes  . Smokeless tobacco: Never Used  Substance Use Topics  . Alcohol use: Yes    Alcohol/week: 4.0 standard drinks    Types: 4 Cans of beer per week  . Drug use: No     Family Hx: The patient's family history is not on file.  ROS:    Please see the history of present illness.    Recent cough as noted above.  No medication side effects. All other systems reviewed and are negative.   Prior CV studies:   The following studies were reviewed today:  Recent chest x-ray was unremarkable for fluid, cardiomegaly, or infiltrate.  Labs/Other Tests and Data Reviewed:    EKG:  No ECG reviewed.  Recent Labs: No results found for requested labs within last 8760 hours.   Recent Lipid Panel Lab Results  Component Value Date/Time   CHOL 149 10/08/2015 08:03 AM   TRIG 225 (H) 10/08/2015 08:03 AM   HDL 43 10/08/2015 08:03 AM   CHOLHDL 3.5 10/08/2015 08:03 AM   CHOLHDL 3 05/15/2014 08:00 AM   LDLCALC 61 10/08/2015 08:03 AM    Wt Readings from Last 3 Encounters:  09/07/18 161 lb (73 kg)  09/07/17 162 lb (73.5 kg)  07/14/16 172 lb 9.6 oz (78.3 kg)     Objective:    Vital Signs:  BP 134/82   Pulse 64   Ht 5\' 5"  (1.651 m)   Wt 161 lb (73 kg)   BMI 26.79 kg/m    VITAL SIGNS:  reviewed GEN:  no acute distress RESPIRATORY:  normal respiratory effort, symmetric expansion NEURO:  alert and oriented x 3, no obvious focal deficit  ASSESSMENT & PLAN:    1. Coronary artery disease involving native coronary artery of native heart without angina pectoris   2. Hyperlipidemia LDL goal <70    PLAN:   1. Long discussion concerning secondary prevention.  Please see dialogue below. 2. LDL target less than 70.  No recent data.  Triglyceride was elevated the last time.  He is on moderate intensity statin therapy.  Educated that a lipid panel and liver should be done at least once a year.  If triglycerides are still elevated should discontinue omega-3 fish oil and convert to icosapent ethyl.  Overall education and awareness concerning primary/secondary risk prevention was discussed in detail: LDL less than 70, hemoglobin A1c less than 7, blood pressure target less than 130/80 mmHg, >150 minutes of moderate aerobic activity per  week, avoidance of smoking, weight control (via diet and exercise), and continued surveillance/management of/for obstructive sleep apnea.    COVID-19 Education: The signs and symptoms of COVID-19 were discussed with the patient and how to seek care for testing (follow up with PCP or arrange E-visit).  The importance of social distancing was discussed today.  Time:   Today, I have spent 15 minutes with the patient with telehealth technology discussing the above problems.     Medication Adjustments/Labs and Tests Ordered: Current medicines are reviewed at length with the patient today.  Concerns regarding medicines are outlined above.   Tests  Ordered: No orders of the defined types were placed in this encounter.   Medication Changes: No orders of the defined types were placed in this encounter.   Disposition:  Follow up in 1 year(s)  Signed, Sinclair Grooms, MD  09/07/2018 10:43 AM    [place]

## 2018-09-07 ENCOUNTER — Telehealth (INDEPENDENT_AMBULATORY_CARE_PROVIDER_SITE_OTHER): Payer: 59 | Admitting: Interventional Cardiology

## 2018-09-07 ENCOUNTER — Telehealth: Payer: Self-pay

## 2018-09-07 ENCOUNTER — Other Ambulatory Visit: Payer: Self-pay

## 2018-09-07 ENCOUNTER — Encounter: Payer: Self-pay | Admitting: Interventional Cardiology

## 2018-09-07 ENCOUNTER — Telehealth: Payer: Self-pay | Admitting: Interventional Cardiology

## 2018-09-07 VITALS — BP 134/82 | HR 64 | Ht 65.0 in | Wt 161.0 lb

## 2018-09-07 DIAGNOSIS — E785 Hyperlipidemia, unspecified: Secondary | ICD-10-CM

## 2018-09-07 DIAGNOSIS — I2510 Atherosclerotic heart disease of native coronary artery without angina pectoris: Secondary | ICD-10-CM | POA: Diagnosis not present

## 2018-09-07 NOTE — Telephone Encounter (Signed)
I called pt to update his med list and get his vital signs for his appt with Dr Tamala Julian this morning at 9:30. No answer. LM for pt to call back.

## 2018-09-07 NOTE — Telephone Encounter (Signed)
Pt was contacted by CMA.  Visit is in progress at this time.

## 2018-09-07 NOTE — Telephone Encounter (Signed)
New message   Patient has not received a call about virtual visit today. Please call.

## 2018-09-07 NOTE — Patient Instructions (Signed)

## 2018-09-18 DIAGNOSIS — M50321 Other cervical disc degeneration at C4-C5 level: Secondary | ICD-10-CM | POA: Diagnosis not present

## 2018-09-18 DIAGNOSIS — M5031 Other cervical disc degeneration,  high cervical region: Secondary | ICD-10-CM | POA: Diagnosis not present

## 2018-10-19 ENCOUNTER — Telehealth: Payer: Self-pay | Admitting: *Deleted

## 2018-10-19 NOTE — Telephone Encounter (Signed)
   Primary Cardiologist: Sinclair Grooms, MD  Chart reviewed as part of pre-operative protocol coverage. Patient was contacted 10/19/2018 in reference to pre-operative risk assessment for pending surgery as outlined below.  Brian Dickerson was last seen 09/07/18 virtually by Dr. Tamala Julian and felt to be doing well. I spoke with the patient who affirms he is feeling fine. He has remained physically active without any CP, SOB, palpitations or syncope. Therefore, contingent on reply from Dr. Tamala Julian below about EKG,  the patient would be at acceptable risk for the planned procedure without further cardiovascular testing.   However, I will route to Dr. Tamala Julian for 2 questions: 1) pt has not had EKG in >1 year, will this impact decision for pre-op clearance? 2) how long can pt hold ASA before procedure?  Dr. Tamala Julian - Please route response to P CV DIV PREOP (the pre-op pool). Thank you.  Charlie Pitter, PA-C 10/19/2018, 1:40 PM

## 2018-10-19 NOTE — Telephone Encounter (Signed)
Call placed to pt re: surgical clearance.  Pt has been made aware that Dr. Tamala Julian has cleared him for surgery without having and EKG and that it was ok for him to hold his Aspirin 7 days prior. Pt thanked me for letting him know.

## 2018-10-19 NOTE — Telephone Encounter (Signed)
Callback, please let patient know Dr. Tamala Julian has cleared him to proceed with procedure without EKG and he can stop aspirin 1 week prior to procedure if needed. I had told him we would let him know if Dr. Tamala Julian was OK with holding off EKG. Will route final recs to surgeon's office in separate note.

## 2018-10-19 NOTE — Telephone Encounter (Signed)
   Primary Cardiologist:Henry Nicholes Stairs III, MD  Chart reviewed as part of pre-operative protocol coverage. To summarize recommendations:  -  Based on ACC/AHA guidelines, Brian Dickerson would be at acceptable risk for the planned procedure without further cardiovascular testing.   - Per Dr .Tamala Julian, patient can stop aspirin 1 week prior to procedure if needed.   Will route this bundled recommendation to requesting provider via Epic fax function. Please call with questions.  Charlie Pitter, PA-C 10/19/2018, 3:08 PM

## 2018-10-19 NOTE — Telephone Encounter (Signed)
   [facility] Pre-operative Risk Assessment    Request for surgical clearance:  1. What type of surgery is being performed? C5-6, C6-7 CERVICAL ARTHROPLASTY  2. When is this surgery scheduled? 11/21/18  3. What type of clearance is required (medical clearance vs. Pharmacy clearance to hold med vs. Both)? MEDICAL  4. Are there any medications that need to be held prior to surgery and how long? ASA   5. Practice name and name of physician performing surgery? [facility]; DR. GARY CRAM  6. What is your office phone number [phone]   7.   What is your office fax number [phone]  8.   Anesthesia type (None, local, MAC, general) ? GENERAL   Brian Dickerson 10/19/2018, 1:17 PM  _________________________________________________________________   (provider comments below)

## 2018-10-19 NOTE — Telephone Encounter (Signed)
Stop aspirin 1 week. ECK will not be helpful if asymptomatic.

## 2018-11-25 ENCOUNTER — Other Ambulatory Visit: Payer: Self-pay | Admitting: Interventional Cardiology

## 2018-11-25 DIAGNOSIS — R0789 Other chest pain: Secondary | ICD-10-CM

## 2018-11-25 DIAGNOSIS — I2510 Atherosclerotic heart disease of native coronary artery without angina pectoris: Secondary | ICD-10-CM

## 2019-10-04 ENCOUNTER — Other Ambulatory Visit: Payer: Self-pay

## 2019-10-04 DIAGNOSIS — R0789 Other chest pain: Secondary | ICD-10-CM

## 2019-10-04 DIAGNOSIS — I2510 Atherosclerotic heart disease of native coronary artery without angina pectoris: Secondary | ICD-10-CM

## 2019-10-04 MED ORDER — PANTOPRAZOLE SODIUM 40 MG PO TBEC: 40.0000 mg | DELAYED_RELEASE_TABLET | Freq: Every day | ORAL | 0 refills | Status: DC

## 2019-10-23 IMAGING — CR CHEST - 2 VIEW
2 series · 2 of 2 positions shown · non-contrast
Comparison: January 10, 2014

CLINICAL DATA: Cough

EXAM:
CHEST - 2 VIEW

[w chest pa]
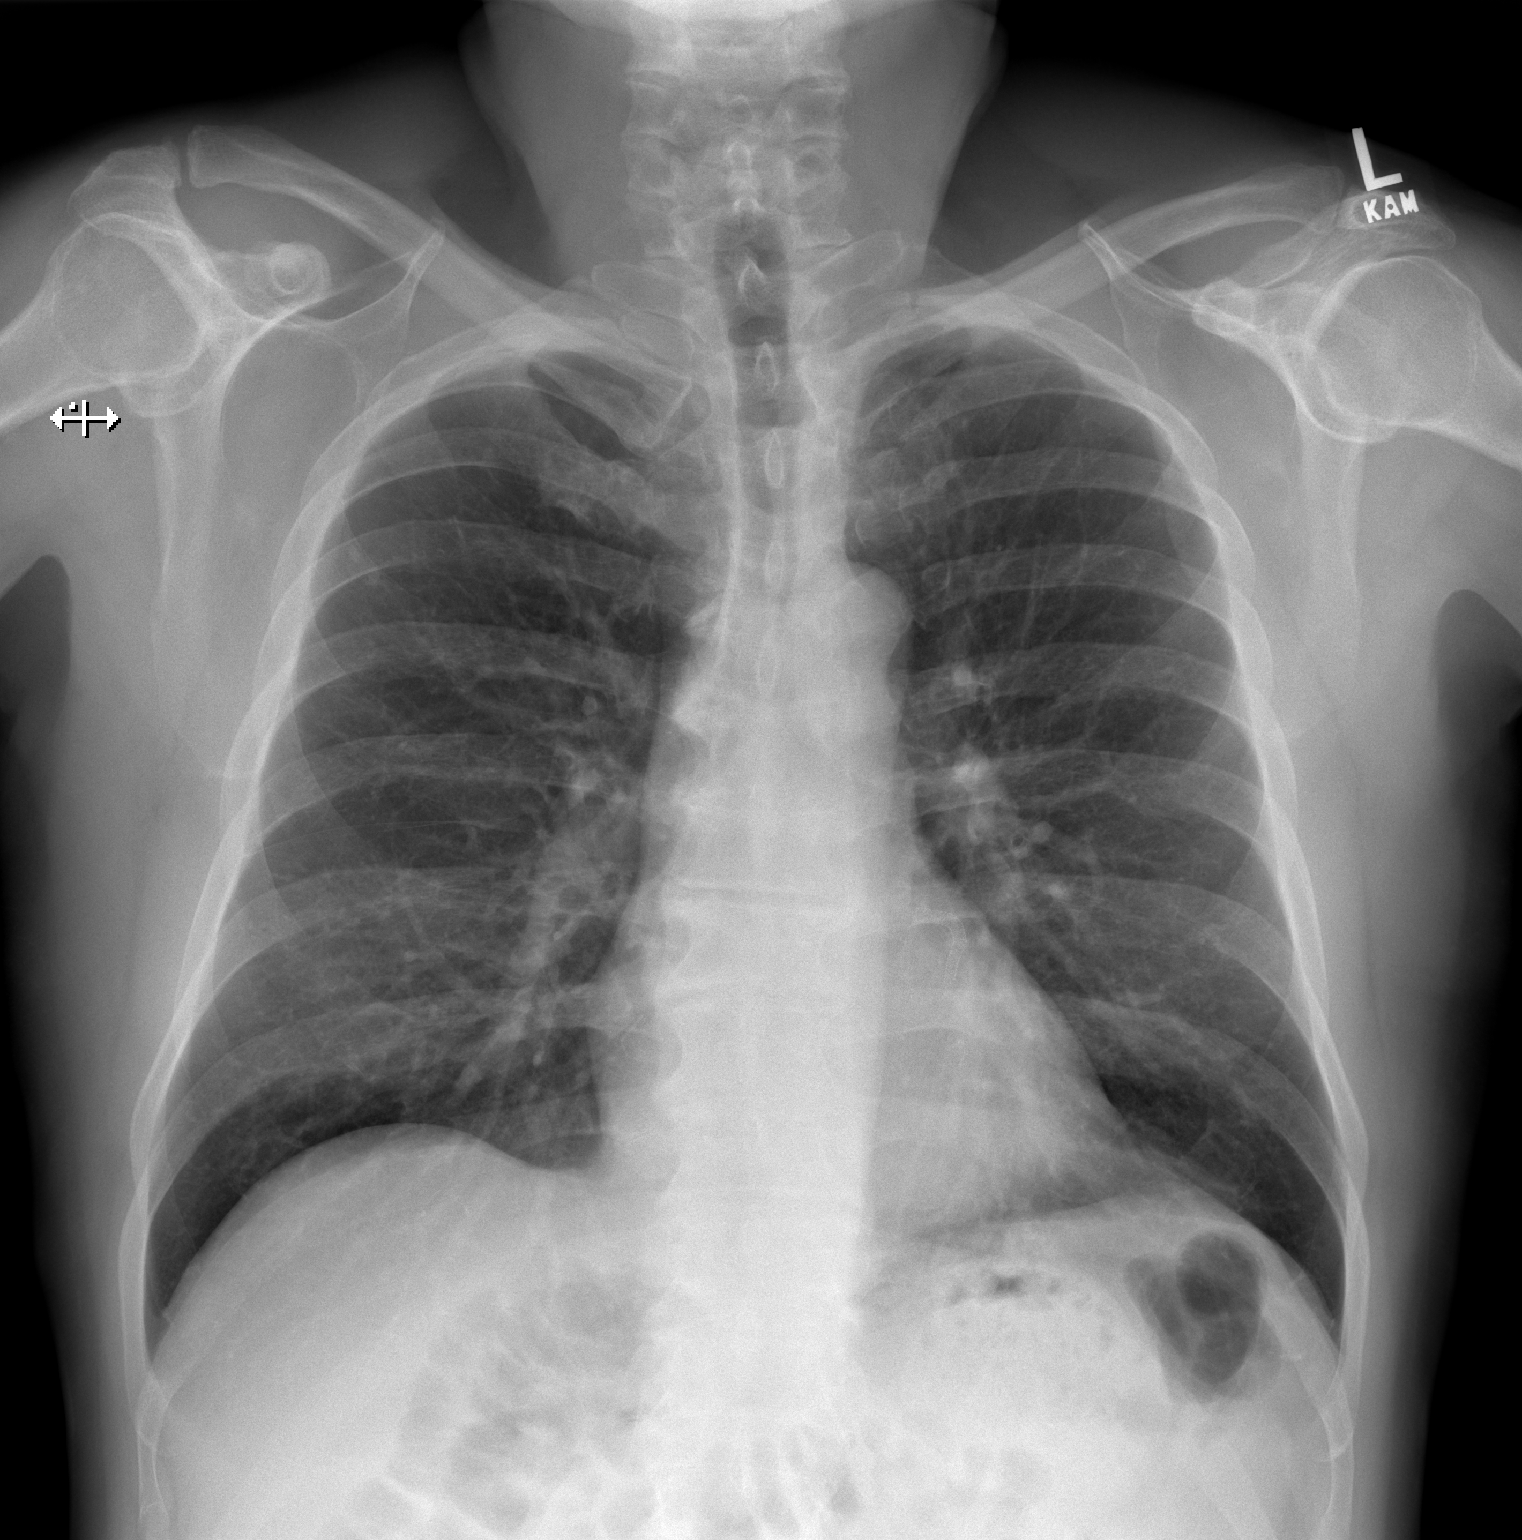

[w chest lat]
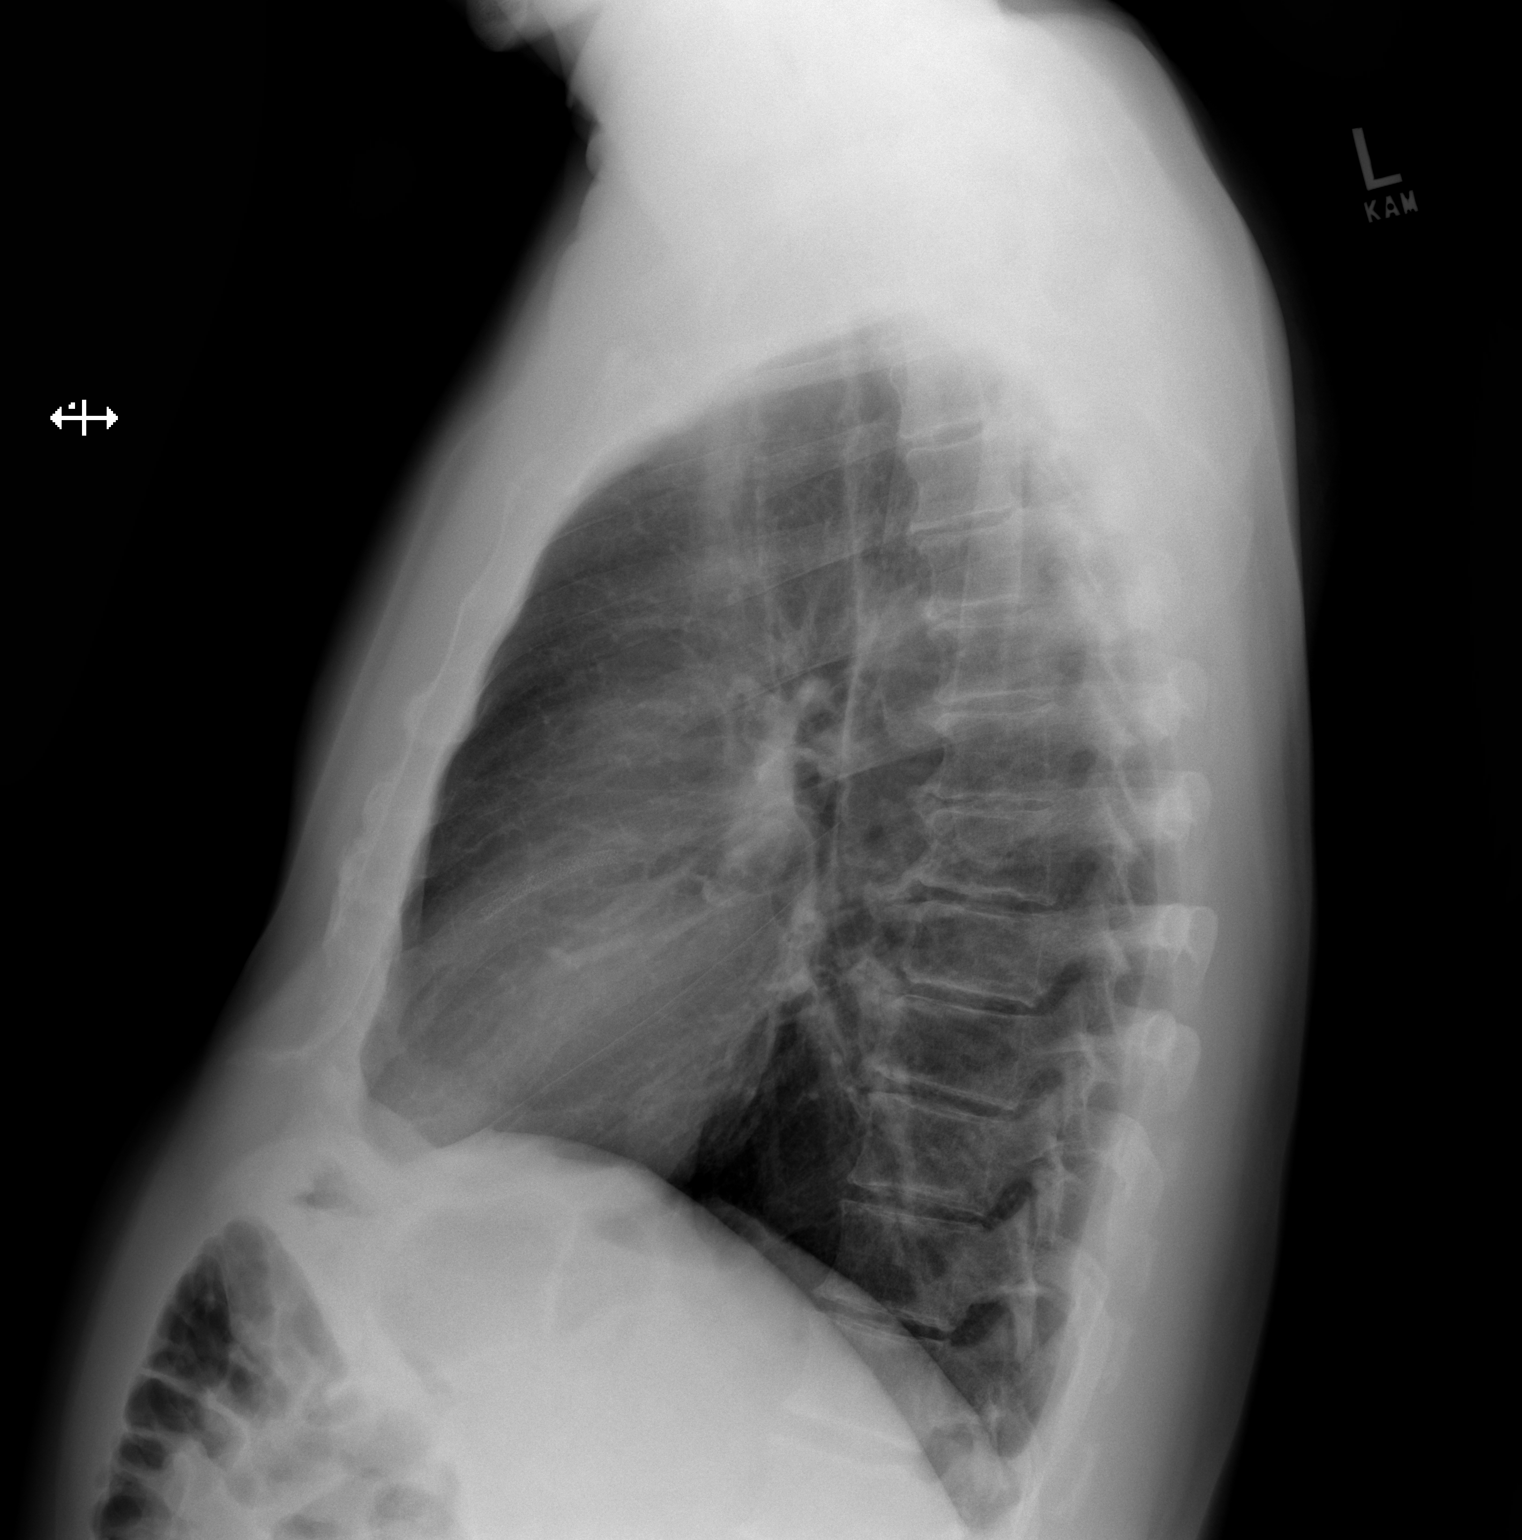

[2 of 2 positions shown; findings below may reference images not displayed]

FINDINGS: No edema or consolidation. The heart size and pulmonary vascularity
are normal. No adenopathy. There is degenerative change in the
thoracic spine.
IMPRESSION: No edema or consolidation.

## 2019-11-07 ENCOUNTER — Other Ambulatory Visit: Payer: Self-pay

## 2019-11-07 DIAGNOSIS — R0789 Other chest pain: Secondary | ICD-10-CM

## 2019-11-07 DIAGNOSIS — I2510 Atherosclerotic heart disease of native coronary artery without angina pectoris: Secondary | ICD-10-CM

## 2019-11-07 MED ORDER — PANTOPRAZOLE SODIUM 40 MG PO TBEC: 40.0000 mg | DELAYED_RELEASE_TABLET | Freq: Every day | ORAL | 0 refills | Status: DC

## 2019-12-03 ENCOUNTER — Other Ambulatory Visit: Payer: Self-pay

## 2019-12-03 DIAGNOSIS — R0789 Other chest pain: Secondary | ICD-10-CM

## 2019-12-03 DIAGNOSIS — I2510 Atherosclerotic heart disease of native coronary artery without angina pectoris: Secondary | ICD-10-CM

## 2019-12-03 MED ORDER — PANTOPRAZOLE SODIUM 40 MG PO TBEC: 40.0000 mg | DELAYED_RELEASE_TABLET | Freq: Every day | ORAL | 0 refills | Status: AC
